# Patient Record
Sex: Female | Born: 1985 | Race: Black or African American | Hispanic: No | Marital: Married | Smoking: Former smoker
Health system: Southern US, Community
[De-identification: ages and names within clinical notes are randomized; demographics above are authoritative.]

## PROBLEM LIST (undated history)

## (undated) ENCOUNTER — Inpatient Hospital Stay (HOSPITAL_COMMUNITY): Payer: Self-pay

## (undated) DIAGNOSIS — D573 Sickle-cell trait: Secondary | ICD-10-CM

## (undated) DIAGNOSIS — B9689 Other specified bacterial agents as the cause of diseases classified elsewhere: Secondary | ICD-10-CM

## (undated) DIAGNOSIS — R87619 Unspecified abnormal cytological findings in specimens from cervix uteri: Secondary | ICD-10-CM

## (undated) DIAGNOSIS — B009 Herpesviral infection, unspecified: Secondary | ICD-10-CM

## (undated) DIAGNOSIS — IMO0002 Reserved for concepts with insufficient information to code with codable children: Secondary | ICD-10-CM

## (undated) DIAGNOSIS — A749 Chlamydial infection, unspecified: Secondary | ICD-10-CM

## (undated) DIAGNOSIS — N760 Acute vaginitis: Secondary | ICD-10-CM

## (undated) DIAGNOSIS — J45909 Unspecified asthma, uncomplicated: Secondary | ICD-10-CM

## (undated) HISTORY — DX: Unspecified asthma, uncomplicated: J45.909

## (undated) HISTORY — DX: Sickle-cell trait: D57.3

## (undated) HISTORY — PX: COLPOSCOPY W/ BIOPSY / CURETTAGE: SUR283

---

## 2008-05-18 ENCOUNTER — Emergency Department (HOSPITAL_COMMUNITY): Admission: EM | Admit: 2008-05-18 | Discharge: 2008-05-18 | Payer: Self-pay | Admitting: Family Medicine

## 2010-04-27 ENCOUNTER — Emergency Department (HOSPITAL_BASED_OUTPATIENT_CLINIC_OR_DEPARTMENT_OTHER): Admission: EM | Admit: 2010-04-27 | Discharge: 2010-04-27 | Payer: Self-pay | Admitting: Emergency Medicine

## 2010-08-17 ENCOUNTER — Emergency Department (HOSPITAL_COMMUNITY)
Admission: EM | Admit: 2010-08-17 | Discharge: 2010-08-17 | Payer: Self-pay | Source: Home / Self Care | Admitting: Emergency Medicine

## 2010-11-19 ENCOUNTER — Ambulatory Visit (HOSPITAL_COMMUNITY)
Admission: RE | Admit: 2010-11-19 | Discharge: 2010-11-19 | Payer: Self-pay | Source: Home / Self Care | Attending: Obstetrics and Gynecology | Admitting: Obstetrics and Gynecology

## 2010-11-22 ENCOUNTER — Inpatient Hospital Stay (HOSPITAL_COMMUNITY)
Admission: AD | Admit: 2010-11-22 | Discharge: 2010-11-22 | Payer: Self-pay | Source: Home / Self Care | Attending: Obstetrics and Gynecology | Admitting: Obstetrics and Gynecology

## 2010-11-26 ENCOUNTER — Ambulatory Visit (HOSPITAL_COMMUNITY)
Admission: RE | Admit: 2010-11-26 | Discharge: 2010-11-26 | Payer: Self-pay | Source: Home / Self Care | Attending: Obstetrics and Gynecology | Admitting: Obstetrics and Gynecology

## 2010-11-29 ENCOUNTER — Inpatient Hospital Stay (HOSPITAL_COMMUNITY)
Admission: AD | Admit: 2010-11-29 | Discharge: 2010-12-01 | Payer: Self-pay | Source: Home / Self Care | Attending: Obstetrics and Gynecology | Admitting: Obstetrics and Gynecology

## 2011-02-10 LAB — URINALYSIS, ROUTINE W REFLEX MICROSCOPIC
Bilirubin Urine: NEGATIVE
Glucose, UA: NEGATIVE mg/dL
Ketones, ur: NEGATIVE mg/dL
Leukocytes, UA: NEGATIVE
Specific Gravity, Urine: <1.005 — ABNORMAL LOW (ref 1.005–1.030)
pH: 6 (ref 5.0–8.0)

## 2011-02-10 LAB — CBC
Hemoglobin: 10.3 g/dL — ABNORMAL LOW (ref 12.0–15.0)
MCH: 21.1 pg — ABNORMAL LOW (ref 26.0–34.0)
MCV: 67 fL — ABNORMAL LOW (ref 78.0–100.0)
Platelets: 225 10*3/uL (ref 150–400)
Platelets: 241 10*3/uL (ref 150–400)
RBC: 4.64 MIL/uL (ref 3.87–5.11)
RBC: 4.89 MIL/uL (ref 3.87–5.11)
RDW: 21.8 % — ABNORMAL HIGH (ref 11.5–15.5)
WBC: 17.2 10*3/uL — ABNORMAL HIGH (ref 4.0–10.5)

## 2011-02-10 LAB — COMPREHENSIVE METABOLIC PANEL
AST: 24 U/L (ref 0–37)
Alkaline Phosphatase: 246 U/L — ABNORMAL HIGH (ref 39–117)
Alkaline Phosphatase: 273 U/L — ABNORMAL HIGH (ref 39–117)
BUN: 3 mg/dL — ABNORMAL LOW (ref 6–23)
Calcium: 8.7 mg/dL (ref 8.4–10.5)
Chloride: 108 mEq/L (ref 96–112)
Creatinine, Ser: 0.57 mg/dL (ref 0.4–1.2)
GFR calc Af Amer: >60 mL/min (ref >60)
GFR calc non Af Amer: >60 mL/min (ref >60)
Glucose, Bld: 122 mg/dL — ABNORMAL HIGH (ref 70–99)
Glucose, Bld: 87 mg/dL (ref 70–99)
Potassium: 3.5 mEq/L (ref 3.5–5.1)
Potassium: 3.5 mEq/L (ref 3.5–5.1)
Sodium: 137 mEq/L (ref 135–145)

## 2011-02-10 LAB — URINE MICROSCOPIC-ADD ON

## 2011-02-10 LAB — RPR: RPR Ser Ql: NONREACTIVE

## 2011-02-10 LAB — URIC ACID: Uric Acid, Serum: 5.3 mg/dL (ref 2.4–7.0)

## 2011-02-13 LAB — POCT URINALYSIS DIPSTICK
Bilirubin Urine: NEGATIVE
Glucose, UA: NEGATIVE mg/dL
Hgb urine dipstick: NEGATIVE
Ketones, ur: NEGATIVE mg/dL
Nitrite: NEGATIVE
Protein, ur: NEGATIVE mg/dL
Specific Gravity, Urine: 1.015 (ref 1.005–1.030)

## 2011-02-17 LAB — URINE CULTURE: Colony Count: 50000

## 2011-02-17 LAB — WET PREP, GENITAL

## 2011-02-17 LAB — URINALYSIS, ROUTINE W REFLEX MICROSCOPIC
Bilirubin Urine: NEGATIVE
Glucose, UA: NEGATIVE mg/dL
Ketones, ur: NEGATIVE mg/dL
Protein, ur: NEGATIVE mg/dL
Specific Gravity, Urine: 1.018 (ref 1.005–1.030)
Urobilinogen, UA: 0.2 mg/dL (ref 0.0–1.0)
pH: 5.5 (ref 5.0–8.0)

## 2011-02-17 LAB — RPR: RPR Ser Ql: NONREACTIVE

## 2011-02-17 LAB — GC/CHLAMYDIA PROBE AMP, GENITAL: Chlamydia, DNA Probe: NEGATIVE

## 2011-02-17 LAB — URINE MICROSCOPIC-ADD ON

## 2011-03-17 ENCOUNTER — Ambulatory Visit (INDEPENDENT_AMBULATORY_CARE_PROVIDER_SITE_OTHER): Payer: BLUE CROSS/BLUE SHIELD

## 2011-03-17 ENCOUNTER — Inpatient Hospital Stay (INDEPENDENT_AMBULATORY_CARE_PROVIDER_SITE_OTHER)
Admission: RE | Admit: 2011-03-17 | Discharge: 2011-03-17 | Disposition: A | Discharge status: Home / Self Care | Payer: BLUE CROSS/BLUE SHIELD | Source: Ambulatory Visit | Attending: Family Medicine | Admitting: Family Medicine

## 2011-03-17 DIAGNOSIS — R109 Unspecified abdominal pain: Secondary | ICD-10-CM

## 2011-03-17 LAB — WET PREP, GENITAL
Trich, Wet Prep: NONE SEEN
Yeast Wet Prep HPF POC: NONE SEEN

## 2011-03-17 LAB — POCT URINALYSIS DIP (DEVICE)
Hgb urine dipstick: NEGATIVE
Specific Gravity, Urine: 1.02 (ref 1.005–1.030)
Urobilinogen, UA: 0.2 mg/dL (ref 0.0–1.0)

## 2011-03-18 LAB — GC/CHLAMYDIA PROBE AMP, GENITAL: GC Probe Amp, Genital: NEGATIVE

## 2011-08-28 LAB — WET PREP, GENITAL: Trich, Wet Prep: NONE SEEN

## 2012-08-29 ENCOUNTER — Encounter (HOSPITAL_COMMUNITY): Payer: Self-pay | Admitting: Obstetrics and Gynecology

## 2012-08-29 ENCOUNTER — Inpatient Hospital Stay (HOSPITAL_COMMUNITY)
Admission: AD | Admit: 2012-08-29 | Discharge: 2012-08-29 | Disposition: A | Discharge status: Home / Self Care | Payer: Self-pay | Source: Ambulatory Visit | Attending: Obstetrics and Gynecology | Admitting: Obstetrics and Gynecology

## 2012-08-29 DIAGNOSIS — B9689 Other specified bacterial agents as the cause of diseases classified elsewhere: Secondary | ICD-10-CM | POA: Insufficient documentation

## 2012-08-29 DIAGNOSIS — O2390 Unspecified genitourinary tract infection in pregnancy, unspecified trimester: Secondary | ICD-10-CM | POA: Insufficient documentation

## 2012-08-29 DIAGNOSIS — N760 Acute vaginitis: Secondary | ICD-10-CM | POA: Insufficient documentation

## 2012-08-29 DIAGNOSIS — A499 Bacterial infection, unspecified: Secondary | ICD-10-CM | POA: Insufficient documentation

## 2012-08-29 DIAGNOSIS — N949 Unspecified condition associated with female genital organs and menstrual cycle: Secondary | ICD-10-CM | POA: Insufficient documentation

## 2012-08-29 DIAGNOSIS — Z3490 Encounter for supervision of normal pregnancy, unspecified, unspecified trimester: Secondary | ICD-10-CM

## 2012-08-29 HISTORY — DX: Unspecified abnormal cytological findings in specimens from cervix uteri: R87.619

## 2012-08-29 HISTORY — DX: Other specified bacterial agents as the cause of diseases classified elsewhere: B96.89

## 2012-08-29 HISTORY — DX: Reserved for concepts with insufficient information to code with codable children: IMO0002

## 2012-08-29 HISTORY — DX: Other specified bacterial agents as the cause of diseases classified elsewhere: N76.0

## 2012-08-29 LAB — WET PREP, GENITAL
Trich, Wet Prep: NONE SEEN
Yeast Wet Prep HPF POC: NONE SEEN

## 2012-08-29 LAB — POCT PREGNANCY, URINE: Preg Test, Ur: POSITIVE — AB

## 2012-08-29 MED ORDER — METRONIDAZOLE 500 MG PO TABS: 500.0000 mg | ORAL_TABLET | Freq: Two times a day (BID) | ORAL | Status: DC

## 2012-08-29 NOTE — MAU Note (Signed)
"  I have a thick yellowish d/c that has a foul odor for the past 3 weeks.  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  I've been having some abd cramping and back pain."

## 2012-08-29 NOTE — MAU Note (Signed)
Patient states she has had a vaginal discharge with odor for about 3 weeks. Has had a positive pregnancy test at [facility]. Has not seen an MD yet pending Medicaid.

## 2012-08-29 NOTE — MAU Provider Note (Signed)
History     CSN: [id number]  Arrival date & time 08/29/12  1444   None     Chief Complaint  Patient presents with  . Vaginal Discharge    (Consider location/radiation/quality/duration/timing/severity/associated sxs/prior treatment) HPI Angela Ashley is a 26 y.o. G2P1001 at [redacted]w[redacted]d. Pt presents with c/o BV. She gets this frequently, has yellow/green discharge, foul odor, no itching or irritation. Same partner x 5 yr. No cramping robleeding. Plans [facility] with Dr Ambrose Mantle, is waiting to get Medicaid started.   Past Medical History  Diagnosis Date  . Abnormal Pap smear   . Bacterial vaginosis     reoccuring    Past Surgical History  Procedure Date  . Colposcopy w/ biopsy / curettage     Dr. in [facility]    Family History  Problem Relation Age of Onset  . Diabetes Paternal Grandmother     History  Substance Use Topics  . Smoking status: Former Smoker    Types: Cigarettes  . Smokeless tobacco: Not on file  . Alcohol Use: No     Occassional use before pregnancy    OB History    Grav Para Term Preterm Abortions TAB SAB Ect Mult Living   2 1 1       1       Review of Systems  Constitutional: Negative for fever and chills.  Genitourinary: Positive for frequency and vaginal discharge. Negative for dysuria, urgency and vaginal bleeding.    Allergies  Review of patient's allergies indicates not on file.  Home Medications  No current outpatient prescriptions on file.  BP 106/76  Pulse 95  Temp 98.6 F (37 C) (Oral)  Resp 16  Ht 4' 10.5" (1.486 m)  Wt 94 lb (42.638 kg)  BMI 19.31 kg/m2  SpO2 100%  LMP 06/26/2012  Breastfeeding? Unknown  Physical Exam  Constitutional: She is oriented to person, place, and time. She appears well-developed and well-nourished. No distress.  Abdominal: Soft. There is no tenderness. There is no rebound and no guarding.       Informal U/S, + cardiac activity  Genitourinary:       Pelvic: Vulva- skin intact, nl anatomy Vagina-  mod amt creamy yellowish discharge Cx- nullip Uterus- 8-10 wk size Adn- no masses pal, non tender  Musculoskeletal: Normal range of motion.  Neurological: She is alert and oriented to person, place, and time.  Skin: Skin is warm and dry.  Psychiatric: She has a normal mood and affect. Her behavior is normal.    ED Course  Procedures (including critical care time)  Labs Reviewed  POCT PREGNANCY, URINE - Abnormal; Notable for the following:    Preg Test, Ur POSITIVE (*)     All other components within normal limits  WET PREP, GENITAL  GC/CHLAMYDIA PROBE AMP, GENITAL   No results found. , Results for orders placed during the hospital encounter of 08/29/12 (from the past 24 hour(s))  POCT PREGNANCY, URINE     Status: Abnormal   Collection Time   08/29/12  3:06 PM      Component Value Range   Preg Test, Ur POSITIVE (*) NEGATIVE  WET PREP, GENITAL     Status: Abnormal   Collection Time   08/29/12  3:30 PM      Component Value Range   Yeast Wet Prep HPF POC NONE SEEN  NONE SEEN   Trich, Wet Prep NONE SEEN  NONE SEEN   Clue Cells Wet Prep HPF POC MANY (*) NONE SEEN  WBC, Wet Prep HPF POC MODERATE (*) NONE SEEN     No diagnosis found.  ASSESSMENT: BV Pregnancy @ 9 wks GC/CT pending  PLAN:  Flagyl 500 mg BID x 7 Make appt for prenatal care when gets Medicaid  MDM

## 2012-08-31 LAB — GC/CHLAMYDIA PROBE AMP, GENITAL
Chlamydia, DNA Probe: POSITIVE — AB
GC Probe Amp, Genital: NEGATIVE

## 2012-09-06 ENCOUNTER — Other Ambulatory Visit: Payer: Self-pay | Admitting: Advanced Practice Midwife

## 2012-09-06 MED ORDER — METRONIDAZOLE 0.75 % VA GEL: 1.0000 | Freq: Two times a day (BID) | VAGINAL | Status: DC

## 2012-09-08 ENCOUNTER — Other Ambulatory Visit: Payer: Self-pay | Admitting: Advanced Practice Midwife

## 2012-09-08 NOTE — Progress Notes (Signed)
Pt called, unable to tolerate PO flagyl, requested rx for metrogel. Order sent to pharmacy.

## 2012-09-10 ENCOUNTER — Inpatient Hospital Stay (HOSPITAL_COMMUNITY)
Admission: AD | Admit: 2012-09-10 | Discharge: 2012-09-10 | Disposition: A | Discharge status: Home / Self Care | Payer: Medicaid Other | Source: Ambulatory Visit | Attending: Obstetrics & Gynecology | Admitting: Obstetrics & Gynecology

## 2012-09-10 ENCOUNTER — Encounter (HOSPITAL_COMMUNITY): Payer: Self-pay | Admitting: *Deleted

## 2012-09-10 ENCOUNTER — Inpatient Hospital Stay (HOSPITAL_COMMUNITY): Payer: Medicaid Other

## 2012-09-10 DIAGNOSIS — Z3490 Encounter for supervision of normal pregnancy, unspecified, unspecified trimester: Secondary | ICD-10-CM

## 2012-09-10 DIAGNOSIS — N739 Female pelvic inflammatory disease, unspecified: Secondary | ICD-10-CM | POA: Insufficient documentation

## 2012-09-10 DIAGNOSIS — O98319 Other infections with a predominantly sexual mode of transmission complicating pregnancy, unspecified trimester: Secondary | ICD-10-CM

## 2012-09-10 DIAGNOSIS — A5619 Other chlamydial genitourinary infection: Secondary | ICD-10-CM | POA: Insufficient documentation

## 2012-09-10 DIAGNOSIS — A568 Sexually transmitted chlamydial infection of other sites: Secondary | ICD-10-CM

## 2012-09-10 DIAGNOSIS — O98819 Other maternal infectious and parasitic diseases complicating pregnancy, unspecified trimester: Secondary | ICD-10-CM

## 2012-09-10 DIAGNOSIS — A749 Chlamydial infection, unspecified: Secondary | ICD-10-CM

## 2012-09-10 DIAGNOSIS — O209 Hemorrhage in early pregnancy, unspecified: Secondary | ICD-10-CM | POA: Insufficient documentation

## 2012-09-10 HISTORY — DX: Chlamydial infection, unspecified: A74.9

## 2012-09-10 MED ORDER — ONDANSETRON 8 MG PO TBDP
8.0000 mg | ORAL_TABLET | ORAL | Status: AC
  Administered 2012-09-10: 8 mg via ORAL
  Filled 2012-09-10: qty 1

## 2012-09-10 MED ORDER — AZITHROMYCIN 250 MG PO TABS
1000.0000 mg | ORAL_TABLET | ORAL | Status: AC
  Administered 2012-09-10: 1000 mg via ORAL
  Filled 2012-09-10: qty 4

## 2012-09-10 NOTE — MAU Provider Note (Signed)
Chart reviewed and agree with management and plan.  

## 2012-09-10 NOTE — MAU Note (Signed)
Bleeding started last night. Noticed when she wiped. A small blood clot came out. Bleeding has now stopped. Was dx with BV, given rx for flagyl. States has never been able to tolerate the tablets. Has since gotten Rx changed to gel and inserted first does last night prior to bleeding. Informed patient she was positive for chlamydia on last visit. Patient states she did not receive a call and has not been treated.  Denies pain. Last intercourse 3 weeks ago.

## 2012-09-10 NOTE — MAU Provider Note (Signed)
Chief Complaint: Vaginal Bleeding   None    SUBJECTIVE HPI: Angela Ashley is a 26 y.o. G2P1001 at [redacted]w[redacted]d by LMP who presents to maternity admissions reporting vaginal bleeding last night described as red when she wiped x1 episode with one small clot, but not requiring a pad afterwards.  She has had some brown spotting today but no more bright red bleeding.  She was seen in MAU 08/29/12 and prescribed Flagyl for BV which she was unable to tolerate because of vomiting.  She called MAU and her prescription was recently changed to Metrogel, which she started last night ~3 hours before episode of bleeding.  She denies LOF, vaginal itching/burning, urinary symptoms, h/a, dizziness, n/v, or fever/chills.  The pt was also informed today of her positive chlamydia test on 08/29/12.  She was not aware of this and reports never receiving a phone call.  The pt is married and was upset and shocked at this diagnosis.    She has not yet started prenatal care and because of her work schedule, she missed her first appointment to apply for Medicaid and has not yet completed her application.  She planned to see Dr Ambrose Mantle for prenatal care but does not want to wait another 45 days before beginning care.       Past Medical History  Diagnosis Date  . Abnormal Pap smear   . Bacterial vaginosis     reoccuring  . Chlamydia    Past Surgical History  Procedure Date  . Colposcopy w/ biopsy / curettage     Dr. in [place]   History   Social History  . Marital Status: Married    Spouse Name: N/A    Number of Children: N/A  . Years of Education: N/A   Occupational History  . Not on file.   Social History Main Topics  . Smoking status: Former Smoker    Types: Cigarettes  . Smokeless tobacco: Never Used  . Alcohol Use: No     Occassional use before pregnancy  . Drug Use: No  . Sexually Active: Yes    Birth Control/ Protection: None   Other Topics Concern  . Not on file   Social History Narrative    . No narrative on file   No current facility-administered medications on file prior to encounter.   No current outpatient prescriptions on file prior to encounter.   No Known Allergies  ROS: Pertinent items in HPI  OBJECTIVE Blood pressure 115/67, pulse 99, temperature 98.5 F (36.9 C), temperature source Oral, resp. rate 18, height 5' (1.524 m), weight 44.453 kg (98 lb), last menstrual period 06/26/2012, SpO2 100.00%. GENERAL: Well-developed, well-nourished female in no acute distress.  HEENT: Normocephalic HEART: normal rate RESP: normal effort ABDOMEN: Soft, non-tender EXTREMITIES: Nontender, no edema NEURO: Alert and oriented Pelvic exam: Cervix pink with some erythema at os and posterior side of cervix, visually closed, large amount of brown, thick discharge, no bright red blood noted, vaginal walls and external genitalia normal Bimanual exam: Cervix 0/long/high, firm, posterior, neg CMT, uterus nontender, ~10 week size, adnexa without tenderness, enlargement, or mass   IMAGING    ASSESSMENT 1. Chlamydia infection complicating pregnancy   2. Normal IUP (intrauterine pregnancy) on prenatal ultrasound     PLAN Azithromycin 1g PO x1 dose now in MAU Reviewed normal U/S findings with pt Discharge home Encouraged partner treatment at health department Message sent to [facility] clinic for prenatal care and test of cure Return to MAU as needed.  Bleeding precautions given.    Medication List     As of 09/10/2012 10:25 AM    TAKE these medications         metroNIDAZOLE 0.75 % vaginal gel   Commonly known as: METROGEL   Place 1 Applicatorful vaginally 2 (two) times daily.      prenatal multivitamin Tabs   Take 1 tablet by mouth at bedtime.           Sharen Counter Certified Nurse-Midwife 09/10/2012  10:09 AM

## 2012-09-20 ENCOUNTER — Encounter: Payer: Self-pay | Admitting: Advanced Practice Midwife

## 2012-09-20 ENCOUNTER — Telehealth: Payer: Self-pay | Admitting: Advanced Practice Midwife

## 2012-09-20 NOTE — Telephone Encounter (Signed)
Called patient to inform her she had missed her appointment. Patient stated she was working and needed an afternoon appointment. When I informed her she could only come in the mornings, she stated she would call back later and reschedule.

## 2012-10-27 ENCOUNTER — Other Ambulatory Visit: Payer: Self-pay

## 2012-11-09 ENCOUNTER — Other Ambulatory Visit (HOSPITAL_COMMUNITY): Payer: Self-pay | Admitting: Obstetrics and Gynecology

## 2012-11-09 DIAGNOSIS — IMO0002 Reserved for concepts with insufficient information to code with codable children: Secondary | ICD-10-CM

## 2012-11-10 ENCOUNTER — Ambulatory Visit (HOSPITAL_COMMUNITY)
Admission: RE | Admit: 2012-11-10 | Discharge: 2012-11-10 | Disposition: A | Discharge status: Home / Self Care | Payer: Medicaid Other | Source: Ambulatory Visit | Attending: Obstetrics and Gynecology | Admitting: Obstetrics and Gynecology

## 2012-11-10 DIAGNOSIS — IMO0002 Reserved for concepts with insufficient information to code with codable children: Secondary | ICD-10-CM

## 2012-11-10 DIAGNOSIS — O358XX0 Maternal care for other (suspected) fetal abnormality and damage, not applicable or unspecified: Secondary | ICD-10-CM | POA: Insufficient documentation

## 2012-11-12 ENCOUNTER — Encounter: Payer: Self-pay | Admitting: Obstetrics and Gynecology

## 2013-03-26 ENCOUNTER — Encounter (HOSPITAL_COMMUNITY)
Admission: AD | Disposition: A | Discharge status: Home / Self Care | Payer: Self-pay | Source: Ambulatory Visit | Attending: Obstetrics and Gynecology

## 2013-03-26 ENCOUNTER — Inpatient Hospital Stay (HOSPITAL_COMMUNITY)
Admission: AD | Admit: 2013-03-26 | Discharge: 2013-03-29 | DRG: 766 | Disposition: A | Discharge status: Home / Self Care | Payer: Medicaid Other | Source: Ambulatory Visit | Attending: Obstetrics and Gynecology | Admitting: Obstetrics and Gynecology

## 2013-03-26 ENCOUNTER — Encounter (HOSPITAL_COMMUNITY): Payer: Self-pay | Admitting: Anesthesiology

## 2013-03-26 ENCOUNTER — Encounter (HOSPITAL_COMMUNITY): Payer: Self-pay | Admitting: *Deleted

## 2013-03-26 ENCOUNTER — Inpatient Hospital Stay (HOSPITAL_COMMUNITY): Payer: Medicaid Other | Admitting: Anesthesiology

## 2013-03-26 DIAGNOSIS — B009 Herpesviral infection, unspecified: Secondary | ICD-10-CM

## 2013-03-26 DIAGNOSIS — O4290 Premature rupture of membranes, unspecified as to length of time between rupture and onset of labor, unspecified weeks of gestation: Secondary | ICD-10-CM | POA: Diagnosis present

## 2013-03-26 DIAGNOSIS — O98519 Other viral diseases complicating pregnancy, unspecified trimester: Principal | ICD-10-CM | POA: Diagnosis present

## 2013-03-26 DIAGNOSIS — O368190 Decreased fetal movements, unspecified trimester, not applicable or unspecified: Secondary | ICD-10-CM | POA: Diagnosis present

## 2013-03-26 DIAGNOSIS — A6000 Herpesviral infection of urogenital system, unspecified: Secondary | ICD-10-CM | POA: Diagnosis present

## 2013-03-26 HISTORY — DX: Herpesviral infection, unspecified: B00.9

## 2013-03-26 LAB — CBC WITH DIFFERENTIAL/PLATELET
Eosinophils Relative: 1 % (ref 0–5)
HCT: 33 % — ABNORMAL LOW (ref 36.0–46.0)
Hemoglobin: 10.7 g/dL — ABNORMAL LOW (ref 12.0–15.0)
Lymphocytes Relative: 20 % (ref 12–46)
Lymphs Abs: 2.4 10*3/uL (ref 0.7–4.0)
MCV: 74.3 fL — ABNORMAL LOW (ref 78.0–100.0)
Platelets: 188 10*3/uL (ref 150–400)
RBC: 4.44 MIL/uL (ref 3.87–5.11)
WBC: 12.2 10*3/uL — ABNORMAL HIGH (ref 4.0–10.5)

## 2013-03-26 LAB — RPR: RPR Ser Ql: NONREACTIVE

## 2013-03-26 SURGERY — Surgical Case
Anesthesia: Spinal | Site: Abdomen | Wound class: Clean Contaminated

## 2013-03-26 MED ORDER — METOCLOPRAMIDE HCL 5 MG/ML IJ SOLN: 10.0000 mg | Freq: Three times a day (TID) | INTRAMUSCULAR | Status: DC | PRN

## 2013-03-26 MED ORDER — ONDANSETRON HCL 4 MG/2ML IJ SOLN: 4.0000 mg | INTRAMUSCULAR | Status: DC | PRN

## 2013-03-26 MED ORDER — ONDANSETRON HCL 4 MG PO TABS: 4.0000 mg | ORAL_TABLET | ORAL | Status: DC | PRN

## 2013-03-26 MED ORDER — MORPHINE SULFATE (PF) 0.5 MG/ML IJ SOLN
INTRAMUSCULAR | Status: DC | PRN
  Administered 2013-03-26: .15 mg via INTRATHECAL

## 2013-03-26 MED ORDER — LANOLIN HYDROUS EX OINT: 1.0000 "application " | TOPICAL_OINTMENT | CUTANEOUS | Status: DC | PRN

## 2013-03-26 MED ORDER — FENTANYL CITRATE 0.05 MG/ML IJ SOLN: 25.0000 ug | INTRAMUSCULAR | Status: DC | PRN

## 2013-03-26 MED ORDER — OXYTOCIN 40 UNITS IN LACTATED RINGERS INFUSION - SIMPLE MED: 62.5000 mL/h | INTRAVENOUS | Status: AC

## 2013-03-26 MED ORDER — KETOROLAC TROMETHAMINE 30 MG/ML IJ SOLN: 30.0000 mg | Freq: Four times a day (QID) | INTRAMUSCULAR | Status: AC | PRN

## 2013-03-26 MED ORDER — NALOXONE HCL 1 MG/ML IJ SOLN
1.0000 ug/kg/h | INTRAVENOUS | Status: DC | PRN
  Filled 2013-03-26: qty 2

## 2013-03-26 MED ORDER — DIPHENHYDRAMINE HCL 50 MG/ML IJ SOLN: 25.0000 mg | INTRAMUSCULAR | Status: DC | PRN

## 2013-03-26 MED ORDER — DIPHENHYDRAMINE HCL 50 MG/ML IJ SOLN
12.5000 mg | INTRAMUSCULAR | Status: DC | PRN
  Administered 2013-03-27: 12.5 mg via INTRAVENOUS
  Filled 2013-03-26: qty 1

## 2013-03-26 MED ORDER — CEFAZOLIN SODIUM-DEXTROSE 2-3 GM-% IV SOLR
INTRAVENOUS | Status: AC
  Administered 2013-03-26: 20:00:00
  Filled 2013-03-26: qty 50

## 2013-03-26 MED ORDER — SIMETHICONE 80 MG PO CHEW
80.0000 mg | CHEWABLE_TABLET | Freq: Three times a day (TID) | ORAL | Status: DC
  Administered 2013-03-27 – 2013-03-29 (×9): 80 mg via ORAL

## 2013-03-26 MED ORDER — WITCH HAZEL-GLYCERIN EX PADS: 1.0000 "application " | MEDICATED_PAD | CUTANEOUS | Status: DC | PRN

## 2013-03-26 MED ORDER — ONDANSETRON HCL 4 MG/2ML IJ SOLN
INTRAMUSCULAR | Status: DC | PRN
  Administered 2013-03-26: 4 mg via INTRAVENOUS

## 2013-03-26 MED ORDER — NALBUPHINE HCL 10 MG/ML IJ SOLN
5.0000 mg | INTRAMUSCULAR | Status: DC | PRN
  Administered 2013-03-26 – 2013-03-27 (×3): 10 mg via SUBCUTANEOUS
  Filled 2013-03-26 (×2): qty 1

## 2013-03-26 MED ORDER — FAMOTIDINE IN NACL 20-0.9 MG/50ML-% IV SOLN
INTRAVENOUS | Status: AC
  Filled 2013-03-26: qty 50

## 2013-03-26 MED ORDER — LACTATED RINGERS IV SOLN
INTRAVENOUS | Status: DC
  Administered 2013-03-27: 10:00:00 via INTRAVENOUS

## 2013-03-26 MED ORDER — ONDANSETRON HCL 4 MG/2ML IJ SOLN: 4.0000 mg | Freq: Three times a day (TID) | INTRAMUSCULAR | Status: DC | PRN

## 2013-03-26 MED ORDER — NALBUPHINE HCL 10 MG/ML IJ SOLN
5.0000 mg | INTRAMUSCULAR | Status: DC | PRN
  Administered 2013-03-27: 5 mg via INTRAVENOUS
  Filled 2013-03-26 (×3): qty 1

## 2013-03-26 MED ORDER — MENTHOL 3 MG MT LOZG
1.0000 | LOZENGE | OROMUCOSAL | Status: DC | PRN
  Filled 2013-03-26: qty 9

## 2013-03-26 MED ORDER — PRENATAL MULTIVITAMIN CH
1.0000 | ORAL_TABLET | Freq: Every day | ORAL | Status: DC
  Administered 2013-03-28: 1 via ORAL
  Filled 2013-03-26 (×3): qty 1

## 2013-03-26 MED ORDER — MEASLES, MUMPS & RUBELLA VAC ~~LOC~~ INJ
0.5000 mL | INJECTION | Freq: Once | SUBCUTANEOUS | Status: DC
  Filled 2013-03-26: qty 0.5

## 2013-03-26 MED ORDER — DIPHENHYDRAMINE HCL 25 MG PO CAPS
25.0000 mg | ORAL_CAPSULE | Freq: Four times a day (QID) | ORAL | Status: DC | PRN
  Administered 2013-03-28: 25 mg via ORAL
  Filled 2013-03-26: qty 1

## 2013-03-26 MED ORDER — BUPIVACAINE IN DEXTROSE 0.75-8.25 % IT SOLN
INTRATHECAL | Status: DC | PRN
  Administered 2013-03-26: 12 mg via INTRATHECAL

## 2013-03-26 MED ORDER — SIMETHICONE 80 MG PO CHEW
80.0000 mg | CHEWABLE_TABLET | ORAL | Status: DC | PRN
  Administered 2013-03-27: 80 mg via ORAL

## 2013-03-26 MED ORDER — CEFAZOLIN SODIUM 1-5 GM-% IV SOLN
1.0000 g | Freq: Three times a day (TID) | INTRAVENOUS | Status: AC
  Administered 2013-03-27 (×2): 1 g via INTRAVENOUS
  Filled 2013-03-26 (×2): qty 50

## 2013-03-26 MED ORDER — FAMOTIDINE IN NACL 20-0.9 MG/50ML-% IV SOLN
20.0000 mg | Freq: Once | INTRAVENOUS | Status: AC
  Administered 2013-03-26: 20 mg via INTRAVENOUS

## 2013-03-26 MED ORDER — 0.9 % SODIUM CHLORIDE (POUR BTL) OPTIME
TOPICAL | Status: DC | PRN
  Administered 2013-03-26 (×2): 200 mL

## 2013-03-26 MED ORDER — SODIUM CHLORIDE 0.9 % IJ SOLN
3.0000 mL | INTRAMUSCULAR | Status: DC | PRN
  Administered 2013-03-27: 3 mL via INTRAVENOUS

## 2013-03-26 MED ORDER — SCOPOLAMINE 1 MG/3DAYS TD PT72
1.0000 | MEDICATED_PATCH | Freq: Once | TRANSDERMAL | Status: AC
  Administered 2013-03-26 – 2013-03-27 (×2): 1.5 mg via TRANSDERMAL
  Filled 2013-03-26: qty 1

## 2013-03-26 MED ORDER — OXYCODONE-ACETAMINOPHEN 5-325 MG PO TABS
1.0000 | ORAL_TABLET | ORAL | Status: DC | PRN
  Administered 2013-03-27 – 2013-03-29 (×6): 1 via ORAL
  Filled 2013-03-26 (×6): qty 1

## 2013-03-26 MED ORDER — DIPHENHYDRAMINE HCL 25 MG PO CAPS
25.0000 mg | ORAL_CAPSULE | ORAL | Status: DC | PRN
  Filled 2013-03-26 (×2): qty 1

## 2013-03-26 MED ORDER — FENTANYL CITRATE 0.05 MG/ML IJ SOLN
INTRAMUSCULAR | Status: DC | PRN
  Administered 2013-03-26: 25 ug via INTRAVENOUS

## 2013-03-26 MED ORDER — CITRIC ACID-SODIUM CITRATE 334-500 MG/5ML PO SOLN
30.0000 mL | Freq: Once | ORAL | Status: AC
  Administered 2013-03-26: 30 mL via ORAL
  Filled 2013-03-26: qty 15

## 2013-03-26 MED ORDER — DIBUCAINE 1 % RE OINT
1.0000 "application " | TOPICAL_OINTMENT | RECTAL | Status: DC | PRN
  Filled 2013-03-26: qty 28

## 2013-03-26 MED ORDER — MEPERIDINE HCL 25 MG/ML IJ SOLN: 6.2500 mg | INTRAMUSCULAR | Status: DC | PRN

## 2013-03-26 MED ORDER — NALOXONE HCL 0.4 MG/ML IJ SOLN: 0.4000 mg | INTRAMUSCULAR | Status: DC | PRN

## 2013-03-26 MED ORDER — PHENYLEPHRINE HCL 10 MG/ML IJ SOLN
INTRAMUSCULAR | Status: DC | PRN
  Administered 2013-03-26 (×2): 40 ug via INTRAVENOUS

## 2013-03-26 MED ORDER — KETOROLAC TROMETHAMINE 30 MG/ML IJ SOLN
30.0000 mg | Freq: Four times a day (QID) | INTRAMUSCULAR | Status: AC | PRN
  Administered 2013-03-26: 30 mg via INTRAMUSCULAR

## 2013-03-26 MED ORDER — OXYTOCIN 10 UNIT/ML IJ SOLN
40.0000 [IU] | INTRAVENOUS | Status: DC | PRN
  Administered 2013-03-26: 40 [IU] via INTRAVENOUS

## 2013-03-26 MED ORDER — IBUPROFEN 600 MG PO TABS
600.0000 mg | ORAL_TABLET | Freq: Four times a day (QID) | ORAL | Status: DC
  Administered 2013-03-27 – 2013-03-29 (×10): 600 mg via ORAL
  Filled 2013-03-26 (×10): qty 1

## 2013-03-26 MED ORDER — TETANUS-DIPHTH-ACELL PERTUSSIS 5-2.5-18.5 LF-MCG/0.5 IM SUSP
0.5000 mL | Freq: Once | INTRAMUSCULAR | Status: DC
  Filled 2013-03-26: qty 0.5

## 2013-03-26 MED ORDER — SENNOSIDES-DOCUSATE SODIUM 8.6-50 MG PO TABS
2.0000 | ORAL_TABLET | Freq: Every day | ORAL | Status: DC
  Administered 2013-03-27 – 2013-03-28 (×2): 2 via ORAL

## 2013-03-26 MED ORDER — ZOLPIDEM TARTRATE 5 MG PO TABS: 5.0000 mg | ORAL_TABLET | Freq: Every evening | ORAL | Status: DC | PRN

## 2013-03-26 MED ORDER — LACTATED RINGERS IV SOLN
INTRAVENOUS | Status: DC
  Administered 2013-03-26 (×3): via INTRAVENOUS

## 2013-03-26 SURGICAL SUPPLY — 31 items
CLOTH BEACON ORANGE TIMEOUT ST (SAFETY) ×2 IMPLANT
CONTAINER PREFILL 10% NBF 15ML (MISCELLANEOUS) IMPLANT
DRAPE LG THREE QUARTER DISP (DRAPES) ×2 IMPLANT
DRSG OPSITE POSTOP 4X10 (GAUZE/BANDAGES/DRESSINGS) ×2 IMPLANT
DRSG VASELINE 3X18 (GAUZE/BANDAGES/DRESSINGS) ×2 IMPLANT
DURAPREP 26ML APPLICATOR (WOUND CARE) ×2 IMPLANT
ELECT REM PT RETURN 9FT ADLT (ELECTROSURGICAL) ×2
ELECTRODE REM PT RTRN 9FT ADLT (ELECTROSURGICAL) ×1 IMPLANT
EXTRACTOR VACUUM KIWI (MISCELLANEOUS) IMPLANT
EXTRACTOR VACUUM M CUP 4 TUBE (SUCTIONS) IMPLANT
GLOVE BIO SURGEON STRL SZ7.5 (GLOVE) ×2 IMPLANT
GOWN PREVENTION PLUS XLARGE (GOWN DISPOSABLE) ×2 IMPLANT
GOWN STRL REIN XL XLG (GOWN DISPOSABLE) ×4 IMPLANT
KIT ABG SYR 3ML LUER SLIP (SYRINGE) IMPLANT
NEEDLE HYPO 25X5/8 SAFETYGLIDE (NEEDLE) IMPLANT
NS IRRIG 1000ML POUR BTL (IV SOLUTION) ×2 IMPLANT
PACK C SECTION WH (CUSTOM PROCEDURE TRAY) ×2 IMPLANT
PAD OB MATERNITY 4.3X12.25 (PERSONAL CARE ITEMS) ×2 IMPLANT
RTRCTR C-SECT PINK 25CM LRG (MISCELLANEOUS) ×2 IMPLANT
SLEEVE SCD COMPRESS KNEE MED (MISCELLANEOUS) ×2 IMPLANT
STAPLER VISISTAT 35W (STAPLE) ×2 IMPLANT
SUT PLAIN 0 NONE (SUTURE) IMPLANT
SUT VIC AB 0 CT1 36 (SUTURE) ×12 IMPLANT
SUT VIC AB 3-0 CTX 36 (SUTURE) ×2 IMPLANT
SUT VIC AB 3-0 SH 27 (SUTURE)
SUT VIC AB 3-0 SH 27X BRD (SUTURE) IMPLANT
SUT VIC AB 4-0 KS 27 (SUTURE) IMPLANT
SUT VICRYL 0 TIES 12 18 (SUTURE) IMPLANT
TOWEL OR 17X24 6PK STRL BLUE (TOWEL DISPOSABLE) ×6 IMPLANT
TRAY FOLEY CATH 14FR (SET/KITS/TRAYS/PACK) ×2 IMPLANT
WATER STERILE IRR 1000ML POUR (IV SOLUTION) ×2 IMPLANT

## 2013-03-26 NOTE — MAU Note (Signed)
Pt reprots her water broke at [employer] 20 min ago. Clear fluid haiving some contractions. Stated has not felt baby move much since water broke. She is scheduled for a c-section because of herpes breakout.

## 2013-03-26 NOTE — Op Note (Signed)
NAMEMarland Kitchen  Angela Ashley, Angela Ashley             ACCOUNT NO.:  [id number]  MEDICAL RECORD NO.:  [id number]  LOCATION:  [facility]                          [facility]  PHYSICIAN:  Malachi Pro. Ambrose Mantle, M.D. DATE OF BIRTH:  [birth year]  DATE OF PROCEDURE:  03/26/2013 DATE OF DISCHARGE:                              OPERATIVE REPORT   POSTOPERATIVE DIAGNOSES: 1. Intrauterine pregnancy at 39 weeks. 2. 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  POSTOPERATIVE DIAGNOSES: 1. Intrauterine pregnancy at 39 weeks. 2. 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  OPERATION:  Low-transverse cervical cesarean section.  OPERATOR:  Malachi Pro. Ambrose Mantle, M.D.  ANESTHESIA:  Spinal anesthesia by Angelica Pou, MD  DESCRIPTION OF PROCEDURE:  The patient was brought to the operating room and given a spinal anesthetic by Dr. Malen Gauze.  She was placed in left lateral tilt position.  The urethra was prepped with Betadine solution and a Foley catheter was inserted to straight drain by the RN.  A DuraPrep was then used to prep the abdominal wall and was allowed to dry for 3 minutes, during which time a time-out was done.  The abdomen was then draped as a sterile field.  After anesthesia was confirmed, a transverse incision was made suprapubically through the skin and subcutaneous tissue.  The fascia was incised transversely, separated from the rectus muscles superiorly and inferiorly.  Rectus muscle split in the midline.  Peritoneum opened vertically.  An Alexis retractor was placed into the abdominal cavity and gave good visualization of the lower uterine segment.  A short transverse incision was made through the superficial layers of the myometrium and then I used my finger to enter the amniotic sac.  I pulled superiorly and inferiorly.  The cord was present along with the vertex.  I easily delivered the vertex through the incisional opening, suctioned the baby's nose and mouth.  Delivered the rest of the baby.   Clamped the cord, cut it, and gave the baby to Dr. Everardo All who is in attendance.  He assigned the Apgars.  The baby was very vigorous.  I removed the placenta intact.  The inside of the uterus was palpated, found to be free of any products of conception.  I used the ring forceps to ensure that the cervix was open.  I then closed the uterine incision in 2 layers using a running lock suture of 0 Vicryl on the first layer, nonlocking suture of the same material on the second layer.  Hemostasis was adequate.  I visualized both tubes to their fimbriated ends.  Both ovaries appeared normal.  The uterus was normal. Liberal irrigation confirmed hemostasis.  The gutters were blotted free of blood.  The atelectasis retractor was removed and the abdominal wall was closed in layers using interrupted sutures of 0 Vicryl to close the rectus muscle and peritoneum in 1 layer, 2 running sutures of 0 Vicryl on the fascia, running 3-0 Vicryl on the subcu tissue, and staples on the skin.  The patient seemed to tolerate the procedure well.  Blood loss was about a 1000 mL.  Sponge and needle counts were correct and she was returned to the recovery room in satisfactory condition.  Malachi Pro. Ambrose Mantle, M.D.     TFH/MEDQ  D:  03/26/2013  T:  03/26/2013  Job:  [id number]

## 2013-03-26 NOTE — Anesthesia Procedure Notes (Signed)
Spinal  Patient location during procedure: OR Start time: 03/26/2013 8:32 PM Staffing Anesthesiologist: Huntleigh Doolen A. Performed by: anesthesiologist  Preanesthetic Checklist Completed: patient identified, site marked, surgical consent, pre-op evaluation, timeout performed, IV checked, risks and benefits discussed and monitors and equipment checked Spinal Block Patient position: sitting Prep: site prepped and draped and DuraPrep Patient monitoring: heart rate, cardiac monitor, continuous pulse ox and blood pressure Approach: midline Location: L3-4 Injection technique: single-shot Needle Needle type: Sprotte  Needle gauge: 24 G Needle length: 9 cm Assessment Sensory level: T4 Additional Notes Patient tolerated procedure well. Adequate sensory level.

## 2013-03-26 NOTE — MAU Note (Signed)
FHT 145-150 x 3 min after spinal placement-doppler d/c'd for abd prep

## 2013-03-26 NOTE — Anesthesia Preprocedure Evaluation (Signed)
Anesthesia Evaluation  Patient identified by MRN, date of birth, ID band Patient awake    Reviewed: Allergy & Precautions, H&P , Patient's Chart, lab work & pertinent test results  Airway Mallampati: III TM Distance: >3 FB Neck ROM: full    Dental no notable dental hx. (+) Teeth Intact   Pulmonary former smoker,  breath sounds clear to auscultation  Pulmonary exam normal       Cardiovascular negative cardio ROS  Rhythm:regular Rate:Normal     Neuro/Psych negative neurological ROS  negative psych ROS   GI/Hepatic negative GI ROS, Neg liver ROS,   Endo/Other  negative endocrine ROS  Renal/GU negative Renal ROS  negative genitourinary   Musculoskeletal   Abdominal Normal abdominal exam  (+)   Peds  Hematology negative hematology ROS (+)   Anesthesia Other Findings   Reproductive/Obstetrics (+) Pregnancy SROM 39 weeks  Active labor Active Herpes                           Anesthesia Physical Anesthesia Plan  ASA: II and emergent  Anesthesia Plan: Spinal   Post-op Pain Management:    Induction:   Airway Management Planned:   Additional Equipment:   Intra-op Plan:   Post-operative Plan:   Informed Consent: I have reviewed the patients History and Physical, chart, labs and discussed the procedure including the risks, benefits and alternatives for the proposed anesthesia with the patient or authorized representative who has indicated his/her understanding and acceptance.     Plan Discussed with: Anesthesiologist, CRNA and Surgeon  Anesthesia Plan Comments:         Anesthesia Quick Evaluation

## 2013-03-26 NOTE — H&P (Signed)
NAMEMarland Ashley  SIERA, Ashley             ACCOUNT NO.:  [id number]  MEDICAL RECORD NO.:  [id number]  LOCATION:  [facility]                          [facility]  [facility]  PHYSICIAN:  Malachi Pro. Ambrose Mantle, M.D. DATE OF BIRTH:  1986/01/11  DATE OF ADMISSION:  03/26/2013 DATE OF DISCHARGE:                             HISTORY & PHYSICAL   HISTORY OF PRESENT ILLNESS:  This is a 27 year old black female para 1-0- 0-1, gravida 2 with EDC Apr 02, 2013, admitted with premature rupture of the membranes and an active herpes lesion.  Blood group and type O positive, negative antibody, rubella immune, RPR nonreactive.  Urine culture, 10,000 to 25,000 colonies.  Hepatitis B surface antigen negative, HIV negative, GC and Chlamydia negative.  Hemoglobin electrophoresis AS.  First trimester screen, cystic fibrosis screen, and AFP declined.  One-hour Glucola 106.  Group B strep not recorded.  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  She was placed on Valtrex in our own admission, she is not sure how well she took it because she stated that a lot of times it came up.  The herpes lesion has been present for more than 2 weeks and still present.  I examined her on March 25, 2013, and I have examined her again today and the herpes lesion is still present.  She is admitted for C-section.  PAST MEDICAL HISTORY:  Reveals some abnormal Pap smear in 2010.  She has had intermittent asthma.  She did have gonorrhea in 2011.  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  She has had no surgeries.  She has a history of trichomoniasis.  MEDICATIONS: 1. Valtrex 1 g a day. 2. Prenatal vitamins.  ALLERGIES:  She has no known drug allergies.  No latex allergy.  OBSTETRIC HISTORY:  In December 2011, she delivered a 5-pound 13-ounce female vaginally with epidural  anesthesia.  She was induced for pregnancy-induced high blood pressure.  She is a former drinker of alcohol.  She has no current history of tobacco, alcohol, or drug abuse.  PHYSICAL EXAMINATION:  GENERAL:  On admission, well-developed, well- nourished black female, having contractions. VITAL SIGNS:  Temperature 97.9, pulse 101, respirations 18, blood pressure 134/95. HEART:  Normal size and sounds.  No murmurs. LUNGS:  Clear to auscultation. ABDOMEN:  Soft.  Fundal height term size.  Rupture of membranes was confirmed by the MAU nurse.  The rupture of membranes occurred at 6:15 p.m. on March 26, 2013.  She does have a non-healed herpetic ulcer in the left lower labia majora.  ADMITTING IMPRESSION:  Intrauterine pregnancy at 39 weeks, active herpes genitalis.  The patient is prepared for cesarean section.  She understands and agrees to proceed.  I have discussed this with her on several occasions that if the lesion healed prior to labor or rupture of membranes, we could allow vaginal delivery and even though it has been time to heal, I think her taking of the medication has been improper.     Malachi Pro. Ambrose Mantle, M.D.     TFH/MEDQ  D:  03/26/2013  T:  03/26/2013  Job:  [id number]

## 2013-03-26 NOTE — Anesthesia Postprocedure Evaluation (Signed)
  Anesthesia Post-op Note  Patient: Angela Ashley  Procedure(s) Performed: Procedure(s): Primary CESAREAN SECTION of baby girl  at  2051  APGAR 9/9 (N/A)  Patient Location: PACU  Anesthesia Type:Spinal  Level of Consciousness: awake, alert  and oriented  Airway and Oxygen Therapy: Patient Spontanous Breathing  Post-op Pain: none  Post-op Assessment: Post-op Vital signs reviewed, Patient's Cardiovascular Status Stable, Respiratory Function Stable, Patent Airway, No signs of Nausea or vomiting, Pain level controlled, No headache, No backache, No residual numbness and No residual motor weakness  Post-op Vital Signs: Reviewed and stable  Complications: No apparent anesthesia complications

## 2013-03-26 NOTE — Transfer of Care (Signed)
Immediate Anesthesia Transfer of Care Note  Patient: Angela Ashley  Procedure(s) Performed: Procedure(s): Primary CESAREAN SECTION of baby girl  at  2051  APGAR 9/9 (N/A)  Patient Location: PACU  Anesthesia Type:Spinal  Level of Consciousness: awake and alert   Airway & Oxygen Therapy: Patient Spontanous Breathing  Post-op Assessment: Report given to PACU RN  Post vital signs: Reviewed  Complications: No apparent anesthesia complications

## 2013-03-26 NOTE — Preoperative (Signed)
Beta Blockers   Reason not to administer Beta Blockers:Not Applicable 

## 2013-03-26 NOTE — OR Nursing (Signed)
50 ml blood loss during fundal massage by DLWegner RN, cord blood x 2 to OR front desk 

## 2013-03-27 LAB — CBC
Hemoglobin: 9.6 g/dL — ABNORMAL LOW (ref 12.0–15.0)
MCH: 24.2 pg — ABNORMAL LOW (ref 26.0–34.0)
MCHC: 32.5 g/dL (ref 30.0–36.0)
Platelets: 171 10*3/uL (ref 150–400)
RDW: 17.8 % — ABNORMAL HIGH (ref 11.5–15.5)

## 2013-03-27 LAB — ABO/RH: ABO/RH(D): O POS

## 2013-03-27 NOTE — Progress Notes (Signed)
Patient ID: Angela Ashley, female   DOB: Nov 19, 1986, 27 y.o.   MRN: [id number] #1 afebrile BP normal HGB 10.7 to 9.6 No complaints Urine clear

## 2013-03-27 NOTE — Anesthesia Postprocedure Evaluation (Signed)
  Anesthesia Post-op Note  Patient: Angela Ashley  Procedure(s) Performed: Procedure(s): Primary CESAREAN SECTION of baby girl  at  2051  APGAR 9/9 (N/A)  Patient Location: PACU and Mother/Baby  Anesthesia Type:Spinal  Level of Consciousness: awake, alert  and oriented  Airway and Oxygen Therapy: Patient Spontanous Breathing  Post-op Pain: mild  Post-op Assessment: Patient's Cardiovascular Status Stable, Respiratory Function Stable, Patent Airway, No signs of Nausea or vomiting, Adequate PO intake and Pain level controlled  Post-op Vital Signs: stable  Complications: No apparent anesthesia complications

## 2013-03-28 MED ORDER — VALACYCLOVIR HCL 500 MG PO TABS
1000.0000 mg | ORAL_TABLET | Freq: Every day | ORAL | Status: DC
  Administered 2013-03-28 – 2013-03-29 (×2): 1000 mg via ORAL
  Filled 2013-03-28 (×3): qty 2

## 2013-03-28 NOTE — Progress Notes (Signed)
Patient ID: Angela Ashley, female   DOB: July 12, 1986, 27 y.o.   MRN: [id number] #[id number] afebrile BP normal Tolerating a diet but no flatus. Ambulating well HGB 10.7 to 9.6.

## 2013-03-29 ENCOUNTER — Encounter (HOSPITAL_COMMUNITY): Payer: Self-pay | Admitting: Obstetrics and Gynecology

## 2013-03-29 MED ORDER — OXYCODONE-ACETAMINOPHEN 5-325 MG PO TABS: 1.0000 | ORAL_TABLET | Freq: Four times a day (QID) | ORAL | Status: DC | PRN

## 2013-03-29 MED ORDER — IBUPROFEN 600 MG PO TABS: 600.0000 mg | ORAL_TABLET | Freq: Four times a day (QID) | ORAL | Status: DC | PRN

## 2013-03-29 NOTE — Progress Notes (Signed)
Patient ID: Angela Ashley, female   DOB: 31-Mar-1986, 27 y.o.   MRN: [id number] #3 afebrile no problems for d/c

## 2013-03-30 NOTE — Discharge Summary (Signed)
Angela Ashley, Angela Ashley             ACCOUNT NO.:  [id number]  MEDICAL RECORD NO.:  [id number]  LOCATION:  9147                          [facility]  PHYSICIAN:  Malachi Pro. Ambrose Mantle, M.D. DATE OF BIRTH:  [birth year]  DATE OF ADMISSION:  03/26/2013 DATE OF DISCHARGE:  03/29/2013                              DISCHARGE SUMMARY   HISTORY OF PRESENT ILLNESS:  A 27 year old black female, para 1-0-1, gravida 2 with [facility] Apr 02, 2013, admitted with premature rupture of the membranes, and still an active herpes lesion.  The patient had been treated for herpes lesion for some time that lesion failed to resolve. She is now admitted with premature rupture of the membranes, and will be taken to the operating room for C-section.  The patient underwent a low- transverse cervical C-section by Dr. Ambrose Mantle under spinal anesthesia with delivery of a healthy female infant.  Postpartum, the patient did quite well and was discharged on the third postpartum day, tolerating a regular diet, ambulating well without difficulty, voiding well, and remaining afebrile.  LABORATORY DATA:  Initial hemoglobin was 10.7, hematocrit 33.0, white count 12200, platelet count 188,000.  Followup hemoglobin 9.6, hematocrit 29.5, white count 14,100.  FINAL DIAGNOSES:  Intrauterine pregnancy, 39 weeks, active labor with active herpes lesion on her left lower vulva.  OPERATION:  Low-transverse cervical cesarean section.  FINAL CONDITION:  Improved.  INSTRUCTIONS:  Include our regular discharge instruction booklet as well as our after visit summary.  She is given a prescription for Motrin 600 mg 30 tablets, 1 every 6 hours as needed for pain and Percocet 5/325, 30 tablets, 1 every 6 hours as needed for pain.  She is also advised to take ferrous sulfate 325 mg twice daily as well as the prenatal vitamins.  She is asked to return to the office in about 1 week to have her staples removed.     Malachi Pro. Ambrose Mantle,  M.D.     TFH/MEDQ  D:  03/29/2013  T:  03/30/2013  Job:  [id number]

## 2013-09-19 ENCOUNTER — Encounter: Payer: Self-pay | Admitting: Family

## 2013-09-19 ENCOUNTER — Ambulatory Visit (INDEPENDENT_AMBULATORY_CARE_PROVIDER_SITE_OTHER): Payer: BC Managed Care – PPO | Admitting: Family

## 2013-09-19 VITALS — BP 104/60 | HR 88 | Ht 59.5 in | Wt 99.0 lb

## 2013-09-19 DIAGNOSIS — R634 Abnormal weight loss: Secondary | ICD-10-CM

## 2013-09-19 DIAGNOSIS — R630 Anorexia: Secondary | ICD-10-CM

## 2013-09-19 LAB — CBC WITH DIFFERENTIAL/PLATELET
Basophils Relative: 0.6 % (ref 0.0–3.0)
Eosinophils Absolute: 0.3 10*3/uL (ref 0.0–0.7)
Eosinophils Relative: 3.6 % (ref 0.0–5.0)
Lymphocytes Relative: 30.7 % (ref 12.0–46.0)
MCHC: 33.3 g/dL (ref 30.0–36.0)
MCV: 76.6 fl — ABNORMAL LOW (ref 78.0–100.0)
Monocytes Absolute: 0.6 10*3/uL (ref 0.1–1.0)
Neutrophils Relative %: 57.6 % (ref 43.0–77.0)
Platelets: 280 10*3/uL (ref 150.0–400.0)
RBC: 4.94 Mil/uL (ref 3.87–5.11)
WBC: 7.8 10*3/uL (ref 4.5–10.5)

## 2013-09-19 NOTE — Progress Notes (Signed)
Subjective:    Patient ID: Angela Ashley, female    DOB: 1986-04-01, 27 y.o.   MRN: [id number]  HPI  27 year old the patient to the practice and to be established. She has concerns of weight loss and decreased appetite has been ongoing over the past 6 months. She noticed the difference after giving birth to her child. Her baseline weight is 115 pounds. She is 99 pounds today. Denies any family history of any thyroid disorder. No palpitations, heat or cold intolerance.  Review of Systems  Constitutional: Positive for unexpected weight change.  HENT: Negative.   Respiratory: Negative.   Cardiovascular: Negative.   Gastrointestinal: Negative.   Endocrine: Negative.   Genitourinary: Negative.   Musculoskeletal: Negative.   Skin: Negative.   Neurological: Negative.   Hematological: Negative.   Psychiatric/Behavioral: Negative.    Past Medical History  Diagnosis Date  . Abnormal Pap smear   . Bacterial vaginosis     reoccuring  . Chlamydia   . Herpes     History   Social History  . Marital Status: Married    Spouse Name: N/A    Number of Children: N/A  . Years of Education: N/A   Occupational History  . Not on file.   Social History Main Topics  . Smoking status: Former Smoker    Types: Cigarettes  . Smokeless tobacco: Never Used  . Alcohol Use: No     Comment: Occassional use before pregnancy  . Drug Use: No  . Sexual Activity: Yes    Birth Control/ Protection: None   Other Topics Concern  . Not on file   Social History Narrative  . No narrative on file    Past Surgical History  Procedure Laterality Date  . Colposcopy w/ biopsy / curettage      Dr. in [place]  . Cesarean section N/A 03/26/2013    Procedure: Primary CESAREAN SECTION of baby girl  at  2051  APGAR 9/9;  Surgeon: Bing Plume, MD;  Location: [facility];  Service: Obstetrics;  Laterality: N/A;    Family History  Problem Relation Age of Onset  . Diabetes Paternal Grandmother     No  Known Allergies  Current Outpatient Prescriptions on File Prior to Visit  Medication Sig Dispense Refill  . ibuprofen (ADVIL,MOTRIN) 600 MG tablet Take 1 tablet (600 mg total) by mouth every 6 (six) hours as needed for pain.  30 tablet  0  . oxyCODONE-acetaminophen (PERCOCET/ROXICET) 5-325 MG per tablet Take 1-2 tablets by mouth every 6 (six) hours as needed.  30 tablet  0  . Prenatal Vit-Fe Fumarate-FA (PRENATAL MULTIVITAMIN) TABS Take 1 tablet by mouth at bedtime.       No current facility-administered medications on file prior to visit.    BP 104/60  Pulse 88  Ht 4' 11.5" (1.511 m)  Wt 99 lb (44.906 kg)  BMI 19.67 kg/m2chart    Objective:   Physical Exam  Constitutional: She is oriented to person, place, and time. She appears well-developed and well-nourished.  HENT:  Right Ear: External ear normal.  Left Ear: External ear normal.  Nose: Nose normal.  Mouth/Throat: Oropharynx is clear and moist.  Neck: Normal range of motion. Neck supple.  Cardiovascular: Normal rate, regular rhythm and normal heart sounds.   Pulmonary/Chest: Effort normal and breath sounds normal.  Abdominal: Soft. Bowel sounds are normal.  Musculoskeletal: Normal range of motion.  Neurological: She is alert and oriented to person, place, and time.  Skin: Skin is  warm and dry.  Psychiatric: She has a normal mood and affect.          Assessment & Plan:   assessment: 1. Weight loss 2. Decreased appetite  Plan: CBC and TSH that were patient and the results. Consider Remeron if her labs are stable. Call the office with any questions or concerns. Recheck any labs or sooner if needed.

## 2013-09-20 ENCOUNTER — Other Ambulatory Visit: Payer: Self-pay | Admitting: Family

## 2013-09-20 MED ORDER — MIRTAZAPINE 15 MG PO TABS: 15.0000 mg | ORAL_TABLET | Freq: Every day | ORAL | Status: DC

## 2014-03-24 ENCOUNTER — Encounter: Payer: Self-pay | Admitting: Family Medicine

## 2014-03-24 ENCOUNTER — Ambulatory Visit (INDEPENDENT_AMBULATORY_CARE_PROVIDER_SITE_OTHER): Payer: 59 | Admitting: Family Medicine

## 2014-03-24 VITALS — BP 120/80 | HR 125 | Temp 98.4°F | Ht 59.5 in | Wt 114.0 lb

## 2014-03-24 DIAGNOSIS — R300 Dysuria: Secondary | ICD-10-CM

## 2014-03-24 LAB — POCT URINALYSIS DIPSTICK
BILIRUBIN UA: NEGATIVE
Glucose, UA: NEGATIVE
KETONES UA: NEGATIVE
NITRITE UA: NEGATIVE
PH UA: 7
Protein, UA: NEGATIVE
RBC UA: NEGATIVE
Spec Grav, UA: 1.01
Urobilinogen, UA: 0.2

## 2014-03-24 MED ORDER — CIPROFLOXACIN HCL 250 MG PO TABS: 250.0000 mg | ORAL_TABLET | Freq: Two times a day (BID) | ORAL | Status: DC

## 2014-03-24 NOTE — Progress Notes (Addendum)
No chief complaint on file.   HPI:  Acute visit for Dysuria: -started 2 weeks ago -symptoms: mild burning with urination, frequency, urgency -denies: fevers, chills, vomiting, nausea, flank pain, gross hematuria, vaginal symptoms -FDLMP: on nexplanon, no periods -reports hx of UTI  ROS: See pertinent positives and negatives per HPI.  Past Medical History  Diagnosis Date  . Abnormal Pap smear   . Bacterial vaginosis     reoccuring  . Chlamydia   . Herpes     Past Surgical History  Procedure Laterality Date  . Colposcopy w/ biopsy / curettage      [place]  . Cesarean section N/A 03/26/2013    Procedure: Primary CESAREAN SECTION of baby girl  at  2051  APGAR 9/9;  Surgeon: Bing Plumehomas F Henley, MD;  Location: [facility];  Service: Obstetrics;  Laterality: N/A;    Family History  Problem Relation Age of Onset  . Diabetes Paternal Grandmother     History   Social History  . Marital Status: Married    Spouse Name: N/A    Number of Children: N/A  . Years of Education: N/A   Social History Main Topics  . Smoking status: Former Smoker    Types: Cigarettes  . Smokeless tobacco: Never Used  . Alcohol Use: No     Comment: Occassional use before pregnancy  . Drug Use: No  . Sexual Activity: Yes    Birth Control/ Protection: None   Other Topics Concern  . None   Social History Narrative  . None    Current outpatient prescriptions:ciprofloxacin (CIPRO) 250 MG tablet, Take 1 tablet (250 mg total) by mouth 2 (two) times daily., Disp: 6 tablet, Rfl: 0;  mirtazapine (REMERON) 15 MG tablet, Take 1 tablet (15 mg total) by mouth at bedtime., Disp: 30 tablet, Rfl: 1;  nitrofurantoin, macrocrystal-monohydrate, (MACROBID) 100 MG capsule, Take 1 capsule (100 mg total) by mouth 2 (two) times daily., Disp: 14 capsule, Rfl: 0 Prenatal Vit-Fe Fumarate-FA (PRENATAL MULTIVITAMIN) TABS, Take 1 tablet by mouth at bedtime., Disp: , Rfl:   EXAM:  Filed Vitals:   03/24/14 1408  BP:  120/80  Pulse: 125  Temp: 98.4 F (36.9 C)    Body mass index is 22.65 kg/(m^2).  GENERAL: vitals reviewed and listed above, alert, oriented, appears well hydrated and in no acute distress  HEENT: atraumatic, conjunttiva clear, no obvious abnormalities on inspection of external nose and ears  NECK: no obvious masses on inspection  LUNGS: clear to auscultation bilaterally, no wheezes, rales or rhonchi, good air movement  CV: HRRR, no peripheral edema  MS: moves all extremities without noticeable abnormality  PSYCH: pleasant and cooperative, no obvious depression or anxiety  ASSESSMENT AND PLAN:  Discussed the following assessment and plan:  Dysuria - Plan: POCT urinalysis dipstick, ciprofloxacin (CIPRO) 250 MG tablet, Urine culture, nitrofurantoin, macrocrystal-monohydrate, (MACROBID) 100 MG capsule, CANCELED: CULTURE, URINE COMPREHENSIVE, DISCONTINUED: ciprofloxacin (CIPRO) 250 MG tablet, CANCELED: Urine culture  -leuks on dip, otherwise ok, she has been doing azo and cranberry juice, discussed options andshe wishes to do empiric tx with cipro (disucssed risks) and obtain culture  -Patient advised to return or notify a doctor immediately if symptoms worsen or persist or new concerns arise.  Patient Instructions  -As we discussed, we have prescribed a new medication for you at this appointment. We discussed the common and serious potential adverse effects of this medication and you can review these and more with the pharmacist when you pick up your  medication.  Please follow the instructions for use carefully and notify us immediately if you have any problems taking this medication.  -follow up as needed     Terressa KoyanagiHannah R. Hilbert Briggs

## 2014-03-24 NOTE — Addendum Note (Signed)
Addended by: Rita OharaHRASHER, Kitara Hebb R on: 03/24/2014 02:44 PM   Modules accepted: Orders

## 2014-03-24 NOTE — Addendum Note (Signed)
Addended by: Rita OharaHRASHER, Diaz Crago R on: 03/24/2014 02:46 PM   Modules accepted: Orders

## 2014-03-24 NOTE — Patient Instructions (Signed)

## 2014-03-24 NOTE — Progress Notes (Signed)
Pre visit review using our clinic review tool, if applicable. No additional management support is needed unless otherwise documented below in the visit note. 

## 2014-03-27 LAB — URINE CULTURE: Colony Count: >=100000

## 2014-03-27 MED ORDER — NITROFURANTOIN MONOHYD MACRO 100 MG PO CAPS: 100.0000 mg | ORAL_CAPSULE | Freq: Two times a day (BID) | ORAL | Status: DC

## 2014-03-27 NOTE — Addendum Note (Signed)
Addended by: Terressa KoyanagiKIM, Zonnique Norkus R on: 03/27/2014 11:11 AM   Modules accepted: Orders

## 2014-04-25 ENCOUNTER — Telehealth: Payer: Self-pay | Admitting: Family

## 2014-04-25 NOTE — Telephone Encounter (Signed)
Pt returning your call. Pt states ok to leave message about results.

## 2014-04-26 NOTE — Telephone Encounter (Signed)
See result note - please leave message for her. Thanks. Urine infection resistant to cipro and needed different abx - sent to pharmacy. Advise to see her doctor if worsening or persistent symptoms after treatment.

## 2014-04-26 NOTE — Telephone Encounter (Signed)
I left a detailed message with the results at the pts home number. 

## 2014-07-26 ENCOUNTER — Encounter: Payer: Self-pay | Admitting: Physician Assistant

## 2014-07-26 ENCOUNTER — Ambulatory Visit (INDEPENDENT_AMBULATORY_CARE_PROVIDER_SITE_OTHER): Payer: 59 | Admitting: Physician Assistant

## 2014-07-26 VITALS — BP 100/70 | HR 60 | Temp 98.4°F | Resp 18 | Wt 115.0 lb

## 2014-07-26 DIAGNOSIS — L218 Other seborrheic dermatitis: Secondary | ICD-10-CM

## 2014-07-26 DIAGNOSIS — L219 Seborrheic dermatitis, unspecified: Secondary | ICD-10-CM

## 2014-07-26 MED ORDER — DERMA-SMOOTHE/FS BODY 0.01 % EX OIL: TOPICAL_OIL | CUTANEOUS | Status: DC

## 2014-07-26 NOTE — Patient Instructions (Addendum)
Derma-smoothe Applied to hair as directed.  Contact Dr. Linton Rump McMichael's office at [phone] to schedule a new pt appointment.   If emergency symptoms discussed during visit developed, seek medical attention immediately.  Followup as needed, or for worsening or persistent symptoms despite treatment.     Seborrheic Dermatitis Seborrheic dermatitis involves pink or red skin with greasy, flaky scales. It often occurs where there are more oil (sebaceous) glands. This condition is also known as dandruff. When this condition affects a baby's scalp, it is called cradle cap. It may come and go for no known reason. It can occur at any time of life from infancy to old age. TREATMENT  Cortisone (steroid) ointments, creams, and lotions can help decrease inflammation.  Babies can be treated with baby oil to soften the scales, then they may be washed with baby shampoo. If this does not help, a prescription topical steroid medicine may work.  Adults can use medicated shampoos.  Your caregiver may prescribe corticosteroid cream and shampoo containing an antifungal or yeast medicine (ketoconazole). Hydrocortisone or anti-yeast cream can be rubbed directly into seborrheic dermatitis patches. Yeast does not cause seborrheic dermatitis, but it seems to add to the problem.  In infants, do not aggressively remove the scales or flakes on the scalp with a comb or by other means. This may lead to hair loss. SEEK MEDICAL CARE IF:   The problem does not improve from the medicated shampoos, lotions, or other medicines given by your caregiver.  You have any other questions or concerns. Document Released: 12/25/2004 Document Revised: 05/18/2012 Document Reviewed: 04/08/2010 [facility] Patient Information 2015 [place], [place]. This information is not intended to replace advice given to you by your health care provider. Make sure you discuss any questions you have with your health care provider.

## 2014-07-26 NOTE — Progress Notes (Signed)
Pre visit review using our clinic review tool, if applicable. No additional management support is needed unless otherwise documented below in the visit note. 

## 2014-07-26 NOTE — Progress Notes (Signed)
Subjective:    Patient ID: Angela Ashley, female    DOB: June 27, 1986, 28 y.o.   MRN: [id number]  HPI Patient is a 28 y.o. female presenting for hair loss. Pt states that she has been noticing hair loss for the past 2 years, seems to be gradually worsening. She thinks this is spreading around her scalp. She was worried that she may have ring worm. She states that this itches a lot, and she has noticed some scaling. She denies any draining and no swelling. She denies tenderness to palpation. She has tried cutting her hair off to try to let her hair grow out evenly, and has tried topical vitamin E, however neither of these have helped. Patient denies fevers, chills, nausea, vomiting, diarrhea, shortness of breath, chest pain, headache, syncope.    Review of Systems As per HPI and are otherwise negative.   Past Medical History  Diagnosis Date  . Abnormal Pap smear   . Bacterial vaginosis     reoccuring  . Chlamydia   . Herpes     History   Social History  . Marital Status: Married    Spouse Name: N/A    Number of Children: N/A  . Years of Education: N/A   Occupational History  . Not on file.   Social History Main Topics  . Smoking status: Former Smoker    Types: Cigarettes  . Smokeless tobacco: Never Used  . Alcohol Use: No     Comment: Occassional use before pregnancy  . Drug Use: No  . Sexual Activity: Yes    Birth Control/ Protection: None   Other Topics Concern  . Not on file   Social History Narrative  . No narrative on file    Past Surgical History  Procedure Laterality Date  . Colposcopy w/ biopsy / curettage      Dr. in [place]  . Cesarean section N/A 03/26/2013    Procedure: Primary CESAREAN SECTION of baby girl  at  2051  APGAR 9/9;  Surgeon: Bing Plume, MD;  Location: [facility];  Service: Obstetrics;  Laterality: N/A;    Family History  Problem Relation Age of Onset  . Diabetes Paternal Grandmother     No Known Allergies  No current  outpatient prescriptions on file prior to visit.   No current facility-administered medications on file prior to visit.    EXAM: BP 100/70  Pulse 60  Temp(Src) 98.4 F (36.9 C) (Oral)  Resp 18  Wt 115 lb (52.164 kg)  Breastfeeding? No     Objective:   Physical Exam  Nursing note and vitals reviewed. Constitutional: She is oriented to person, place, and time. She appears well-developed and well-nourished. No distress.  HENT:  Head: Normocephalic and atraumatic.  Cardiovascular: Normal rate, regular rhythm and intact distal pulses.   Pulmonary/Chest: Effort normal and breath sounds normal. No respiratory distress.  Neurological: She is alert and oriented to person, place, and time.  Skin: Skin is warm and dry. She is not diaphoretic.  Scalp has area of yellow crusting skin, visible broken hair shafts. There is no erythema, no swelling, no ttp, no fluctuance, no excessive warmth.  Psychiatric: She has a normal mood and affect. Her behavior is normal. Judgment and thought content normal.     Lab Results  Component Value Date   WBC 7.8 09/19/2013   HGB 12.6 09/19/2013   HCT 37.8 09/19/2013   PLT 280.0 09/19/2013   GLUCOSE 122* 11/30/2010   ALT 20 11/30/2010  AST 42* 11/30/2010   NA 137 11/30/2010   K 3.5 11/30/2010   CL 107 11/30/2010   CREATININE 0.72 11/30/2010   BUN 3* 11/30/2010   CO2 19 11/30/2010   TSH 0.85 09/19/2013        Assessment & Plan:  Angela Ashley was seen today for alopecia.  Diagnoses and associated orders for this visit:  Seborrheic dermatitis of scalp Comments: Trial Derma-smoothe. Will have pt call to schedule with Amy McMichael. - Fluocinolone Acetonide (DERMA-SMOOTHE/FS BODY) 0.01 % OIL; Apply no more than 1 ounce to scalp once daily. Work in thoroughly to hair for about 5 minutes, and then rinse scalp thoroughly with water.    Spoke with Orvan Falconer FNP regarding patient, and I agree with her that this is most likely seborrheic dermatitis, and  I will take her recommendation of trying Derma-Smoothe and having patient call Amy McMichael for appointment. Pt is amenable to this plan.  Return precautions provided, and patient handout on seborrheic dermatitis.  Plan to follow up as needed, or for worsening or persistent symptoms despite treatment.  Patient Instructions  Derma-smoothe Applied to hair as directed.  Contact Dr. Linton Rump McMichael's office at [phone] to schedule a new pt appointment.   If emergency symptoms discussed during visit developed, seek medical attention immediately.  Followup as needed, or for worsening or persistent symptoms despite treatment.

## 2014-08-04 ENCOUNTER — Telehealth: Payer: Self-pay | Admitting: Family

## 2014-08-04 NOTE — Telephone Encounter (Signed)
Pt states the med you rx her is too expensive, need alternate to Fluocinolone Acetonide (DERMA-SMOOTHE/FS BODY) 0.01 % OIL Pt states pharm gave her the name of another, but she cannot find info it started w/ [address] Kitchen [facility]/ [address]

## 2014-08-08 NOTE — Telephone Encounter (Signed)
Per Oran Rein pt should see dermatology to be evaluated.  Left a detailed message for pt at designated cell phone number.

## 2014-08-08 NOTE — Telephone Encounter (Signed)
Pls advise.  

## 2014-08-08 NOTE — Telephone Encounter (Signed)
Called the pharmacy and was told that Derma-smooth was $188 and pt wanted something cheaper. Per pharmacy- pt use to have primary and secondary insurance but now she only has a Editor, commissioning and it is still $150.00. Pt has a deductible. nd it will  The other options are just as expensive.  Pls advise.

## 2014-10-02 ENCOUNTER — Encounter: Payer: Self-pay | Admitting: Physician Assistant

## 2015-02-13 ENCOUNTER — Ambulatory Visit (INDEPENDENT_AMBULATORY_CARE_PROVIDER_SITE_OTHER): Payer: 59 | Admitting: Family Medicine

## 2015-02-13 ENCOUNTER — Encounter: Payer: Self-pay | Admitting: Family Medicine

## 2015-02-13 ENCOUNTER — Other Ambulatory Visit (HOSPITAL_COMMUNITY)
Admission: RE | Admit: 2015-02-13 | Discharge: 2015-02-13 | Disposition: A | Discharge status: Home / Self Care | Payer: 59 | Source: Ambulatory Visit | Attending: Family Medicine | Admitting: Family Medicine

## 2015-02-13 VITALS — BP 92/62 | HR 84 | Temp 98.3°F | Ht 59.5 in | Wt 113.0 lb

## 2015-02-13 DIAGNOSIS — Z113 Encounter for screening for infections with a predominantly sexual mode of transmission: Secondary | ICD-10-CM

## 2015-02-13 DIAGNOSIS — N760 Acute vaginitis: Secondary | ICD-10-CM | POA: Diagnosis present

## 2015-02-13 DIAGNOSIS — N898 Other specified noninflammatory disorders of vagina: Secondary | ICD-10-CM

## 2015-02-13 DIAGNOSIS — Z23 Encounter for immunization: Secondary | ICD-10-CM

## 2015-02-13 DIAGNOSIS — Z3200 Encounter for pregnancy test, result unknown: Secondary | ICD-10-CM

## 2015-02-13 LAB — POCT URINE PREGNANCY: PREG TEST UR: NEGATIVE

## 2015-02-13 NOTE — Addendum Note (Signed)
Addended by: Johnella MoloneyFUNDERBURK, JO A on: 02/13/2015 09:54 AM   Modules accepted: Orders

## 2015-02-13 NOTE — Patient Instructions (Signed)
BEFORE YOU LEAVE: -influenza -Tdap -labs -urine pregnancy test -make sure the front des has a good number where we can contact you regarding your labs  -We have ordered labs or studies at this visit.  It can take up to 1-2 weeks for results and processing. We will contact you with instructions IF your results are abnormal. Normal results will be released to your [facility]. If you have not heard from us or can not find your results in [facility] in 2 weeks please contact our office.

## 2015-02-13 NOTE — Progress Notes (Signed)
Pre visit review using our clinic review tool, if applicable. No additional management support is needed unless otherwise documented below in the visit note. 

## 2015-02-13 NOTE — Progress Notes (Signed)
HPI:  Vaginal Discharge: -reports hx of BV on and off -has had a little discharge the last few weeks with a little odor -reports a few flares of BV per year -she wants to do STI testing as well -sexually active: one partner, female - no know STI exposure -FDLMP: has nexplanon, so rarely get periods, gyn is Dr. Ambrose MantleHenley -denies: fevers, abnormal, abd/pelvic pain, dysuria, weight loss, malaise  ROS: See pertinent positives and negatives per HPI.  Past Medical History  Diagnosis Date  . Abnormal Pap smear   . Bacterial vaginosis     reoccuring  . Chlamydia   . Herpes     Past Surgical History  Procedure Laterality Date  . Colposcopy w/ biopsy / curettage      [place]  . Cesarean section N/A 03/26/2013    Procedure: Primary CESAREAN SECTION of baby girl  at  2051  APGAR 9/9;  Surgeon: Bing Plumehomas F Henley, MD;  Location: [facility];  Service: Obstetrics;  Laterality: N/A;    Family History  Problem Relation Age of Onset  . Diabetes Paternal Grandmother     History   Social History  . Marital Status: Married    Spouse Name: N/A  . Number of Children: N/A  . Years of Education: N/A   Social History Main Topics  . Smoking status: Former Smoker    Types: Cigarettes  . Smokeless tobacco: Never Used  . Alcohol Use: No     Comment: Occassional use before pregnancy  . Drug Use: No  . Sexual Activity: Yes    Birth Control/ Protection: None   Other Topics Concern  . None   Social History Narrative     Current outpatient prescriptions:  .  etonogestrel (NEXPLANON) 68 MG IMPL implant, 1 each by Subdermal route once., Disp: , Rfl:   EXAM:  Filed Vitals:   02/13/15 0909  BP: 92/62  Pulse: 84  Temp: 98.3 F (36.8 C)    Body mass index is 22.45 kg/(m^2).  GENERAL: vitals reviewed and listed above, alert, oriented, appears well hydrated and in no acute distress  HEENT: atraumatic, conjunttiva clear, no obvious abnormalities on inspection of external nose and  ears  NECK: no obvious masses on inspection  ABD: soft, NTTP  GU: normal appearance of ext genitalia, vaginal tissues and cervix, no abnormal discharge or lesions noted, no CMT  MS: moves all extremities without noticeable abnormality  PSYCH: pleasant and cooperative, no obvious depression or anxiety  ASSESSMENT AND PLAN:  Discussed the following assessment and plan:  Vaginal discharge  Routine screening for STI (sexually transmitted infection)  -upreg, CG/Chlam, trich, yeast, BV, HIV, RPR testing -treatment options for BV discussed and she prefer PV if needed, risks discussed -safe sex advised -Tdap and influenza vaccines today -Patient advised to return or notify a doctor immediately if symptoms worsen or persist or new concerns arise.  Patient Instructions  BEFORE YOU LEAVE: -influenza -Tdap -labs -urine pregnancy test -make sure the front des has a good number where we can contact you regarding your labs  -We have ordered labs or studies at this visit.  It can take up to 1-2 weeks for results and processing. We will contact you with instructions IF your results are abnormal. Normal results will be released to your [facility]. If you have not heard from us or can not find your results in [facility] in 2 weeks please contact our office.  Colin Benton R.

## 2015-02-14 LAB — HIV ANTIBODY (ROUTINE TESTING W REFLEX): HIV 1&2 Ab, 4th Generation: NONREACTIVE

## 2015-02-14 LAB — RPR

## 2015-02-15 LAB — CERVICOVAGINAL ANCILLARY ONLY
Chlamydia: NEGATIVE
NEISSERIA GONORRHEA: NEGATIVE
Trichomonas: NEGATIVE

## 2015-02-16 LAB — CERVICOVAGINAL ANCILLARY ONLY
BACTERIAL VAGINITIS: POSITIVE — AB
BACTERIAL VAGINITIS: POSITIVE — AB
Bacterial vaginitis: POSITIVE — AB
Bacterial vaginitis: POSITIVE — AB
CANDIDA VAGINITIS: NEGATIVE

## 2015-02-20 MED ORDER — METRONIDAZOLE 0.75 % VA GEL: VAGINAL | Status: DC

## 2015-02-20 NOTE — Addendum Note (Signed)
Addended by: Johnella MoloneyFUNDERBURK, Delray Reza A on: 02/20/2015 02:54 PM   Modules accepted: Orders

## 2015-02-26 ENCOUNTER — Telehealth: Payer: Self-pay | Admitting: *Deleted

## 2015-02-26 ENCOUNTER — Encounter: Payer: Self-pay | Admitting: *Deleted

## 2015-02-26 NOTE — Telephone Encounter (Signed)
Note completed and the pt is aware this was left at the front desk for her to pick up.

## 2015-02-26 NOTE — Telephone Encounter (Signed)
Please write letter:  "I have not seen this patient recently for any medical issues requiring a work restriction."   Thanks.

## 2015-02-26 NOTE — Telephone Encounter (Signed)
Patient walked in to the office to request a note stating she does not have any work restrictions.  Please call pt when note is complete.

## 2015-05-01 ENCOUNTER — Ambulatory Visit: Payer: Self-pay | Admitting: Family Medicine

## 2015-06-20 ENCOUNTER — Encounter: Payer: Self-pay | Admitting: Family

## 2015-06-20 ENCOUNTER — Other Ambulatory Visit (HOSPITAL_COMMUNITY)
Admission: RE | Admit: 2015-06-20 | Discharge: 2015-06-20 | Disposition: A | Discharge status: Home / Self Care | Payer: 59 | Source: Ambulatory Visit | Attending: Family | Admitting: Family

## 2015-06-20 ENCOUNTER — Ambulatory Visit (INDEPENDENT_AMBULATORY_CARE_PROVIDER_SITE_OTHER): Payer: 59 | Admitting: Family

## 2015-06-20 VITALS — BP 110/80 | HR 93 | Temp 99.6°F | Wt 111.0 lb

## 2015-06-20 DIAGNOSIS — B9689 Other specified bacterial agents as the cause of diseases classified elsewhere: Secondary | ICD-10-CM | POA: Insufficient documentation

## 2015-06-20 DIAGNOSIS — Z113 Encounter for screening for infections with a predominantly sexual mode of transmission: Secondary | ICD-10-CM

## 2015-06-20 DIAGNOSIS — N760 Acute vaginitis: Secondary | ICD-10-CM | POA: Insufficient documentation

## 2015-06-20 DIAGNOSIS — Z01411 Encounter for gynecological examination (general) (routine) with abnormal findings: Secondary | ICD-10-CM | POA: Insufficient documentation

## 2015-06-20 DIAGNOSIS — A499 Bacterial infection, unspecified: Secondary | ICD-10-CM

## 2015-06-20 DIAGNOSIS — Z124 Encounter for screening for malignant neoplasm of cervix: Secondary | ICD-10-CM

## 2015-06-20 MED ORDER — METRONIDAZOLE 0.75 % VA GEL: 1.0000 | VAGINAL | Status: DC

## 2015-06-20 NOTE — Progress Notes (Signed)
Subjective:    Patient ID: Angela Ashley, female    DOB: 1986-02-07, 29 y.o.   MRN: [id number]  HPI 29 year old, African American female seen today with a complaint of bacterial vaginosis and requests STI testing.  Patient has a past medical history of chronic bacterial vaginosis and chlamydia. She is currently sexually active and reports LMP occuring one month ago.  Her current birth control is Nexplanon implant. She denies discharge or itching, but reports smelling a "foul" odor.  Review of Systems  Genitourinary:       Vaginal odor reported.  All other systems reviewed and are negative.     . Past Medical History  Diagnosis Date  . Abnormal Pap smear   . Bacterial vaginosis     reoccuring  . Chlamydia   . Herpes     History   Social History  . Marital Status: Married    Spouse Name: N/A  . Number of Children: N/A  . Years of Education: N/A   Occupational History  . Not on file.   Social History Main Topics  . Smoking status: Former Smoker    Types: Cigarettes  . Smokeless tobacco: Never Used  . Alcohol Use: No     Comment: Occassional use before pregnancy  . Drug Use: No  . Sexual Activity: Yes    Birth Control/ Protection: None   Other Topics Concern  . Not on file   Social History Narrative    Past Surgical History  Procedure Laterality Date  . Colposcopy w/ biopsy / curettage      Dr. in [place]  . Cesarean section N/A 03/26/2013    Procedure: Primary CESAREAN SECTION of baby girl  at  2051  APGAR 9/9;  Surgeon: Bing Plume, MD;  Location: [facility];  Service: Obstetrics;  Laterality: N/A;    Family History  Problem Relation Age of Onset  . Diabetes Paternal Grandmother     No Known Allergies  Current Outpatient Prescriptions on File Prior to Visit  Medication Sig Dispense Refill  . etonogestrel (NEXPLANON) 68 MG IMPL implant 1 each by Subdermal route once.     No current facility-administered medications on file prior to visit.     BP 110/80 mmHg  Pulse 93  Temp(Src) 99.6 F (37.6 C)  Wt 111 lb (50.349 kg)chart Objective:   Physical Exam  Constitutional: She is oriented to person, place, and time. She appears well-developed and well-nourished.  HENT:  Head: Normocephalic.  Right Ear: External ear normal.  Left Ear: External ear normal.  Nose: Nose normal.  Mouth/Throat: Oropharynx is clear and moist.  Eyes: Conjunctivae and EOM are normal. Pupils are equal, round, and reactive to light.  Neck: Normal range of motion. Neck supple.  Cardiovascular: Normal rate, regular rhythm and normal heart sounds.   Pulmonary/Chest: Effort normal and breath sounds normal.  Abdominal: Soft. Bowel sounds are normal. She exhibits no distension and no mass. There is no tenderness. There is no rebound and no guarding.  Genitourinary: Vaginal discharge found.  Discharge thick white and milky.   Musculoskeletal: Normal range of motion.  Neurological: She is alert and oriented to person, place, and time.  Skin: Skin is warm and dry.  Psychiatric: She has a normal mood and affect. Her behavior is normal. Judgment and thought content normal.          Assessment & Plan:  Ross was seen today for discuss std.  Diagnoses and all orders for this visit:  Screening for malignant neoplasm of cervix Orders: -     Cancel: PAP [[place]] -     PAP [[place]]  Screen for sexually transmitted diseases Orders: -     Cancel: PAP [[place]] -     PAP [[place]]  Bacterial vaginosis  Other orders -     metroNIDAZOLE (METROGEL VAGINAL) 0.75 % vaginal gel; Place 1 Applicatorful vaginally once a week.   1. Reoccurrence of bacterial vaginosis-patient history of STI, color and consistency of vaginal discharge is highly suspicious of a current episode of bacterial vaginosis.  The patient complains of frequent reoccurrences of bacterial vaginosis and therefore prescribed treatment with 0.75% metronidazole once weekly,  for a period of 4-6 months.  Will follow-up with results of PAP and STI testing.

## 2015-06-20 NOTE — Patient Instructions (Signed)
Bacterial Vaginosis Bacterial vaginosis is an infection of the vagina. It happens when too many of certain germs (bacteria) grow in the vagina. HOME CARE  Take your medicine as told by your doctor.  Finish your medicine even if you start to feel better.  Do not have sex until you finish your medicine and are better.  Tell your sex partner that you have an infection. They should see their doctor for treatment.  Practice safe sex. Use condoms. Have only one sex partner. GET HELP IF:  You are not getting better after 3 days of treatment.  You have more grey fluid (discharge) coming from your vagina than before.  You have more pain than before.  You have a fever. MAKE SURE YOU:   Understand these instructions.  Will watch your condition.  Will get help right away if you are not doing well or get worse. Document Released: 08/26/2008 Document Revised: 09/07/2013 Document Reviewed: 06/29/2013 ExitCare Patient Information 2015 [employer]. This information is not intended to replace advice given to you by your health care provider. Make sure you discuss any questions you have with your health care provider.  

## 2015-06-21 LAB — CYTOLOGY - PAP

## 2015-06-21 LAB — CERVICOVAGINAL ANCILLARY ONLY: CANDIDA VAGINITIS: NEGATIVE

## 2015-06-22 ENCOUNTER — Other Ambulatory Visit: Payer: Self-pay | Admitting: Family

## 2015-06-22 DIAGNOSIS — B977 Papillomavirus as the cause of diseases classified elsewhere: Secondary | ICD-10-CM

## 2015-06-22 DIAGNOSIS — IMO0002 Reserved for concepts with insufficient information to code with codable children: Secondary | ICD-10-CM

## 2015-06-25 ENCOUNTER — Other Ambulatory Visit: Payer: Self-pay | Admitting: Obstetrics and Gynecology

## 2015-06-25 ENCOUNTER — Telehealth: Payer: Self-pay | Admitting: Obstetrics and Gynecology

## 2015-06-25 DIAGNOSIS — R87612 Low grade squamous intraepithelial lesion on cytologic smear of cervix (LGSIL): Secondary | ICD-10-CM

## 2015-07-04 ENCOUNTER — Telehealth: Payer: Self-pay | Admitting: Family

## 2015-07-04 NOTE — Telephone Encounter (Signed)
Spoke with pt and pt is aware of pap results.  See result notes. Pt states gynecology has called her as well.

## 2015-07-04 NOTE — Telephone Encounter (Signed)
Pt is return [place] call concerning pap results. Pt states its ok to leave result on her cell

## 2015-08-22 ENCOUNTER — Ambulatory Visit (INDEPENDENT_AMBULATORY_CARE_PROVIDER_SITE_OTHER): Payer: 59 | Admitting: Family Medicine

## 2015-08-22 DIAGNOSIS — R69 Illness, unspecified: Secondary | ICD-10-CM

## 2015-08-22 NOTE — Progress Notes (Signed)
NO SHOW

## 2016-09-08 NOTE — Telephone Encounter (Signed)
Ok to close

## 2017-02-11 ENCOUNTER — Ambulatory Visit (INDEPENDENT_AMBULATORY_CARE_PROVIDER_SITE_OTHER): Payer: BLUE CROSS/BLUE SHIELD | Admitting: Family Medicine

## 2017-02-11 VITALS — BP 126/78 | HR 92 | Temp 98.7°F | Resp 17 | Ht 60.5 in | Wt 116.0 lb

## 2017-02-11 DIAGNOSIS — W57XXXA Bitten or stung by nonvenomous insect and other nonvenomous arthropods, initial encounter: Secondary | ICD-10-CM

## 2017-02-11 DIAGNOSIS — R21 Rash and other nonspecific skin eruption: Secondary | ICD-10-CM

## 2017-02-11 NOTE — Patient Instructions (Addendum)
It was very good to meet you today.  We are going to check some lab work.  We will let know these results.  I think you just had a local reaction to whatever bit you. Once we get the results back for the labwork and everything looks good then you'll be in the clear.  If you start having any fevers or chills otherwise not feeling well, come back to see us.    IF you received an x-ray today, you will receive an invoice from [facility]. Please contact [facility] at [phone] with questions or concerns regarding your invoice.   IF you received labwork today, you will receive an invoice from [facility]. Please contact [facility] at [phone] with questions or concerns regarding your invoice.   Our billing staff will not be able to assist you with questions regarding bills from these companies.  You will be contacted with the lab results as soon as they are available. The fastest way to get your results is to activate your My Chart account. Instructions are located on the last page of this paperwork. If you have not heard from us regarding the results in 2 weeks, please contact this office.

## 2017-02-11 NOTE — Progress Notes (Addendum)
Subjective:    Angela Ashley is a 31 y.o. female who presents to PCP today for insect bite:  1.  Insect bite:  Patient was on a cruise this past weekend when she was bitten by something on Saturday. She was on an [place] on excursion when she saw something brown fall onto her arm. She tried brushing it off had immediate sharp stabbing pain. She wasn't able to see exactly what was. She noted some redness and swelling to her left upper arm in the biceps area which is where she felt the sharp stabbing pain. This continued throughout the day. However was completely resolved the next day. She did not take anything for pain or swelling because she went to see it this would worsen or resolve.     Since then she has been doing well without complaints. No fevers or chills. No fatigue or malaise. No nausea or vomiting. No rashes.  She has not put anything on her arm.   PMH:  She has had a C-section in the past.  She has a history of personal asthma.  No prior allergic reactions.    Family history:  Negative for any allergic reactions or lung disorders.   ROS as above per HPI.  Pertinently, no chest pain, palpitations, SOB  The following portions of the patient's history were reviewed and updated as appropriate: allergies, current medications, past medical history, family and social history, and problem list. Patient is a nonsmoker.    PMH reviewed.  Past Medical History:  Diagnosis Date  . Abnormal Pap smear   . Bacterial vaginosis    reoccuring  . Chlamydia   . Herpes    Past Surgical History:  Procedure Laterality Date  . CESAREAN SECTION N/A 03/26/2013   Procedure: Primary CESAREAN SECTION of baby girl  at  2051  APGAR 9/9;  Surgeon: Bing Plume, MD;  Location: [facility];  Service: Obstetrics;  Laterality: N/A;  . COLPOSCOPY W/ BIOPSY / CURETTAGE     Dr. in [place]    Medications reviewed. Current Outpatient Prescriptions  Medication Sig Dispense Refill  . norethindrone-ethinyl  estradiol-iron (ESTROSTEP FE,TILIA FE,TRI-LEGEST FE) 1-20/1-30/1-35 MG-MCG tablet Take 1 tablet by mouth daily.     No current facility-administered medications for this visit.      Objective:   Physical Exam BP 126/78   Pulse 92   Temp 98.7 F (37.1 C) (Oral)   Resp 17   Ht 5' 0.5" (1.537 m)   Wt 116 lb (52.6 kg)   LMP 01/28/2017 (Approximate)   SpO2 97%   BMI 22.28 kg/m  Gen:  Alert, cooperative patient who appears stated age in no acute distress.  Vital signs reviewed. HEENT: EOMI,  MMM Cardiac:  Regular rate and rhythm without murmur  Pulm:  Clear to auscultation bilaterally with good air movement.  No wheezes or rales noted.   Abd:  Soft/nondistended/nontender.  Skin:  Tattoos noted Left upper bicep region.  She does have a miniscule 1 mm in diameter puncture hole without any surrounding erythema or drainage. No induration. No pain on palpation. Musculoskeletal: Strength is 5 out of 5 bilateral upper extremities Neurovascular: Sensation 5 out of 5 all of her fingers and hand. Distal pulses are +2  No results found for this or any previous visit (from the past 72 hour(s)).  Impression/Plan: 1.  Bug bite: - unclear what caused injury - had topical local reaction which completely resolved a few hours later.  - no further sequelae of  this  - she was still concerned and wanted "basic labs" checked to ensure nothing was wrong.  Will obtain BMET and CBC - reassurance and warning precautions provided.

## 2017-02-12 LAB — COMPREHENSIVE METABOLIC PANEL
A/G RATIO: 1.2 (ref 1.2–2.2)
ALBUMIN: 4.3 g/dL (ref 3.5–5.5)
ALK PHOS: 65 IU/L (ref 39–117)
ALT: 10 IU/L (ref 0–32)
AST: 17 IU/L (ref 0–40)
BILIRUBIN TOTAL: 0.6 mg/dL (ref 0.0–1.2)
BUN / CREAT RATIO: 10 (ref 9–23)
BUN: 8 mg/dL (ref 6–20)
CHLORIDE: 100 mmol/L (ref 96–106)
CO2: 21 mmol/L (ref 18–29)
Calcium: 9.9 mg/dL (ref 8.7–10.2)
Creatinine, Ser: 0.79 mg/dL (ref 0.57–1.00)
GFR calc non Af Amer: 102 mL/min/{1.73_m2} (ref >59)
GFR, EST AFRICAN AMERICAN: 118 mL/min/{1.73_m2} (ref >59)
GLUCOSE: 69 mg/dL (ref 65–99)
Globulin, Total: 3.6 g/dL (ref 1.5–4.5)
POTASSIUM: 4.4 mmol/L (ref 3.5–5.2)
Sodium: 140 mmol/L (ref 134–144)
TOTAL PROTEIN: 7.9 g/dL (ref 6.0–8.5)

## 2017-02-12 LAB — CBC WITH DIFFERENTIAL/PLATELET
BASOS ABS: 0.1 10*3/uL (ref 0.0–0.2)
Basos: 1 %
EOS (ABSOLUTE): 0.2 10*3/uL (ref 0.0–0.4)
Eos: 3 %
HEMOGLOBIN: 14.2 g/dL (ref 11.1–15.9)
Hematocrit: 42.9 % (ref 34.0–46.6)
IMMATURE GRANS (ABS): 0 10*3/uL (ref 0.0–0.1)
Immature Granulocytes: 0 %
LYMPHS: 33 %
Lymphocytes Absolute: 2.6 10*3/uL (ref 0.7–3.1)
MCH: 28.6 pg (ref 26.6–33.0)
MCHC: 33.1 g/dL (ref 31.5–35.7)
MCV: 86 fL (ref 79–97)
MONOCYTES: 8 %
Monocytes Absolute: 0.6 10*3/uL (ref 0.1–0.9)
Neutrophils Absolute: 4.3 10*3/uL (ref 1.4–7.0)
Neutrophils: 55 %
Platelets: 303 10*3/uL (ref 150–379)
RBC: 4.97 x10E6/uL (ref 3.77–5.28)
RDW: 13 % (ref 12.3–15.4)
WBC: 7.7 10*3/uL (ref 3.4–10.8)

## 2017-03-06 ENCOUNTER — Telehealth: Payer: Self-pay | Admitting: Family

## 2017-03-06 NOTE — Telephone Encounter (Signed)
Pt had an appt with dr Selena Batten in sept 2016 and no showed. Pt would like to est with dr Selena Batten. Can I sch?

## 2017-03-06 NOTE — Telephone Encounter (Signed)
Ok to schedule NPV. Advise must arrive 15 minutes prior and if recurrent no shows will not be able to see her. See if she can come this afternoon since we have opening - ok.

## 2017-03-06 NOTE — Telephone Encounter (Signed)
lmom for pt to call back

## 2017-03-10 NOTE — Telephone Encounter (Signed)
Pt has been sch

## 2017-03-12 ENCOUNTER — Encounter: Payer: Self-pay | Admitting: Family Medicine

## 2017-03-12 ENCOUNTER — Ambulatory Visit (INDEPENDENT_AMBULATORY_CARE_PROVIDER_SITE_OTHER): Payer: BLUE CROSS/BLUE SHIELD | Admitting: Family Medicine

## 2017-03-12 VITALS — BP 98/60 | HR 64 | Temp 98.3°F | Ht 60.5 in | Wt 113.4 lb

## 2017-03-12 DIAGNOSIS — Z7689 Persons encountering health services in other specified circumstances: Secondary | ICD-10-CM | POA: Diagnosis not present

## 2017-03-12 DIAGNOSIS — Z0000 Encounter for general adult medical examination without abnormal findings: Secondary | ICD-10-CM | POA: Diagnosis not present

## 2017-03-12 DIAGNOSIS — K219 Gastro-esophageal reflux disease without esophagitis: Secondary | ICD-10-CM | POA: Diagnosis not present

## 2017-03-12 LAB — HDL CHOLESTEROL: HDL: 54.8 mg/dL (ref >39.00)

## 2017-03-12 LAB — HEMOGLOBIN A1C: HEMOGLOBIN A1C: 4.7 % (ref 4.6–6.5)

## 2017-03-12 LAB — CHOLESTEROL, TOTAL: CHOLESTEROL: 177 mg/dL (ref 0–200)

## 2017-03-12 NOTE — Patient Instructions (Signed)
BEFORE YOU LEAVE: -follow up: yearly and as needed -labs  Call your gynecology office today to set up a follow up per their recommendations.  Try the dietary things for the acid reflux and as needed tums or zantac. Follow up if worsening, new concerns or persists.  We have ordered labs or studies at this visit. It can take up to 1-2 weeks for results and processing. IF results require follow up or explanation, we will call you with instructions. Clinically stable results will be released to your [facility]. If you have not heard from [place] or cannot find your results in [facility] in 2 weeks please contact our office at [phone].  If you are not yet signed up for [facility], please consider signing up.   We recommend the following healthy lifestyle for LIFE: 1) Small portions.   Tip: eat off of a salad plate instead of a dinner plate.  Tip: It is ok to feel hungry after a meal - that likely means you ate an appropriate portion.  Tip: if you need more or a snack choose fruits, veggies and/or a handful of nuts or seeds.  2) Eat a healthy clean diet.  * Tip: Avoid (less then 1 serving per week): processed foods, sweets, sweetened drinks, white starches (rice, flour, bread, potatoes, pasta, etc), red meat, fast foods, butter  *Tip: CHOOSE instead   * 5-9 servings per day of fresh or frozen fruits and vegetables (but not corn, potatoes, bananas, canned or dried fruit)   *nuts and seeds, beans   *olives and olive oil   *small portions of lean meats such as fish and white chicken    *small portions of whole grains  3)Get at least 150 minutes of sweaty aerobic exercise per week.  4)Reduce stress - consider counseling, meditation and relaxation to balance other aspects of your life.    Food Choices for Gastroesophageal Reflux Disease, Adult When you have gastroesophageal reflux disease (GERD), the foods you eat and your eating habits are very important. Choosing the right foods can help ease your  discomfort. What guidelines do I need to follow?  Choose fruits, vegetables, whole grains, and low-fat dairy products.  Choose low-fat meat, fish, and poultry.  Limit fats such as oils, salad dressings, butter, nuts, and avocado.  Keep a food diary. This helps you identify foods that cause symptoms.  Avoid foods that cause symptoms. These may be different for everyone.  Eat small meals often instead of 3 large meals a day.  Eat your meals slowly, in a place where you are relaxed.  Limit fried foods.  Cook foods using methods other than frying.  Avoid drinking alcohol.  Avoid drinking large amounts of liquids with your meals.  Avoid bending over or lying down until 2-3 hours after eating. What foods are not recommended? These are some foods and drinks that may make your symptoms worse: Vegetables  Tomatoes. Tomato juice. Tomato and spaghetti sauce. Chili peppers. Onion and garlic. Horseradish. Fruits  Oranges, grapefruit, and lemon (fruit and juice). Meats  High-fat meats, fish, and poultry. This includes hot dogs, ribs, ham, sausage, salami, and bacon. Dairy  Whole milk and chocolate milk. Sour cream. Cream. Butter. Ice cream. Cream cheese. Drinks  Coffee and tea. Bubbly (carbonated) drinks or energy drinks. Condiments  Hot sauce. Barbecue sauce. Sweets/Desserts  Chocolate and cocoa. Donuts. Peppermint and spearmint. Fats and Oils  High-fat foods. This includes [place] fries and potato chips. Other  Vinegar. Strong spices. This includes black pepper, white  pepper, red pepper, cayenne, curry powder, cloves, ginger, and chili powder. The items listed above may not be a complete list of foods and drinks to avoid. Contact your dietitian for more information.  This information is not intended to replace advice given to you by your health care provider. Make sure you discuss any questions you have with your health care provider. Document Released: 05/18/2012 Document Revised:  04/24/2016 Document Reviewed: 09/21/2013 Elsevier Interactive Patient Education  2017 Elsevier Inc.  WE NOW OFFER   [facility] FAST TRACK!!!  SAME DAY Appointments for ACUTE CARE  Such as: Sprains, Injuries, cuts, abrasions, rashes, muscle pain, joint pain, back pain Colds, flu, sore throats, headache, allergies, cough, fever  Ear pain, sinus and eye infections Abdominal pain, nausea, vomiting, diarrhea, upset stomach Animal/insect bites  3 Easy Ways to Schedule: Walk-In Scheduling Call in scheduling Mychart Sign-up: [email or web address]

## 2017-03-12 NOTE — Progress Notes (Signed)
HPI:  Angela Ashley is here to establish care and for her preventive care visit. Goes to [facility] and sees Dr. Ambrose Mantle. Hx of abnormal pap smear. Never had diabetes and lipid screening. Active at job - Psychologist, sport and exercise. Feels eats healthy. Has 29 yo and 29 yo girl. Does take MV. Ok to HIV screening and RPR. No sig FH cancer.   ROS negative for unless reported above: fevers, unintentional weight loss, hearing or vision loss, chest pain, palpitations, struggling to breath, hemoptysis, melena, hematochezia, hematuria, falls, loc, si, thoughts of self harm.  Rare GERD - tums helps happens at night - heartburn. < a few times per month. No dysphagia, vomiting, weight loss, etc.   Past Medical History:  Diagnosis Date  . Abnormal Pap smear   . Bacterial vaginosis    reoccuring  . Chlamydia   . Herpes     Past Surgical History:  Procedure Laterality Date  . CESAREAN SECTION N/A 03/26/2013   Procedure: Primary CESAREAN SECTION of baby girl  at  2051  APGAR 9/9;  Surgeon: Bing Plume, MD;  Location: [facility];  Service: Obstetrics;  Laterality: N/A;  . COLPOSCOPY W/ BIOPSY / CURETTAGE     Dr. in [place]    Family History  Problem Relation Age of Onset  . Diabetes Paternal Grandmother     Social History   Social History  . Marital status: Married    Spouse name: N/A  . Number of children: N/A  . Years of education: N/A   Social History Main Topics  . Smoking status: Former Smoker    Types: Cigarettes  . Smokeless tobacco: Never Used  . Alcohol use No     Comment: Occassional use before pregnancy  . Drug use: No  . Sexual activity: Yes    Birth control/ protection: None   Other Topics Concern  . None   Social History Narrative   Work or School: Special educational needs teacher Situation: lives with children 4 and 6 in 03/2017      Spiritual Beliefs: Christian      Lifestyle: diet is ok other then soda, no regular aerobic exercise      Drinks wine 1 glass q 3  weeks.     Current Outpatient Prescriptions:  .  norethindrone-ethinyl estradiol-iron (ESTROSTEP FE,TILIA FE,TRI-LEGEST FE) 1-20/1-30/1-35 MG-MCG tablet, Take 1 tablet by mouth daily., Disp: , Rfl:   EXAM:  Vitals:   03/12/17 1326  BP: 98/60  Pulse: 64  Temp: 98.3 F (36.8 C)    Body mass index is 21.78 kg/m.  GENERAL: vitals reviewed and listed above, alert, oriented, appears well hydrated and in no acute distress  HEENT: atraumatic, conjunttiva clear, no obvious abnormalities on inspection of external nose and ears  NECK: no obvious masses on inspection  LUNGS: clear to auscultation bilaterally, no wheezes, rales or rhonchi, good air movement  CV: HRRR, no peripheral edema  GU/BREAST: declined - will see gyn  ABD: soft, NTTP  MS: moves all extremities without noticeable abnormality  PSYCH: pleasant and cooperative, no obvious depression or anxiety  ASSESSMENT AND PLAN:  Discussed the following assessment and plan:  Visit for preventive health examination - Plan: Hemoglobin A1c, HDL cholesterol, Cholesterol, total -USPSTF recs for her age discussed -she will see her gynecologist for gyn exam - advised she call today to ensur enot late on f/u given her hx abnormal pap -lifestyle recs -labs per orders  Gastroesophageal reflux disease, esophagitis  presence not specified -mild, rare symptoms -advised lifestyle recs prn OTC options and follow up if worsens or persists  Encounter to establish care: -We reviewed the PMH, PSH, FH, SH, Meds and Allergies. -We provided refills for any medications we will prescribe as needed. -We addressed current concerns per orders and patient instructions.   -Patient advised to return or notify a doctor immediately if symptoms worsen or persist or new concerns arise.  Patient Instructions  BEFORE YOU LEAVE: -follow up: yearly and as needed -labs  Call your gynecology office today to set up a follow up per their  recommendations.  Try the dietary things for the acid reflux and as needed tums or zantac. Follow up if worsening, new concerns or persists.  We have ordered labs or studies at this visit. It can take up to 1-2 weeks for results and processing. IF results require follow up or explanation, we will call you with instructions. Clinically stable results will be released to your [facility]. If you have not heard from [place] or cannot find your results in [facility] in 2 weeks please contact our office at [phone].  If you are not yet signed up for [facility], please consider signing up.   We recommend the following healthy lifestyle for LIFE: 1) Small portions.   Tip: eat off of a salad plate instead of a dinner plate.  Tip: It is ok to feel hungry after a meal - that likely means you ate an appropriate portion.  Tip: if you need more or a snack choose fruits, veggies and/or a handful of nuts or seeds.  2) Eat a healthy clean diet.  * Tip: Avoid (less then 1 serving per week): processed foods, sweets, sweetened drinks, white starches (rice, flour, bread, potatoes, pasta, etc), red meat, fast foods, butter  *Tip: CHOOSE instead   * 5-9 servings per day of fresh or frozen fruits and vegetables (but not corn, potatoes, bananas, canned or dried fruit)   *nuts and seeds, beans   *olives and olive oil   *small portions of lean meats such as fish and white chicken    *small portions of whole grains  3)Get at least 150 minutes of sweaty aerobic exercise per week.  4)Reduce stress - consider counseling, meditation and relaxation to balance other aspects of your life.    Food Choices for Gastroesophageal Reflux Disease, Adult When you have gastroesophageal reflux disease (GERD), the foods you eat and your eating habits are very important. Choosing the right foods can help ease your discomfort. What guidelines do I need to follow?  Choose fruits, vegetables, whole grains, and low-fat dairy  products.  Choose low-fat meat, fish, and poultry.  Limit fats such as oils, salad dressings, butter, nuts, and avocado.  Keep a food diary. This helps you identify foods that cause symptoms.  Avoid foods that cause symptoms. These may be different for everyone.  Eat small meals often instead of 3 large meals a day.  Eat your meals slowly, in a place where you are relaxed.  Limit fried foods.  Cook foods using methods other than frying.  Avoid drinking alcohol.  Avoid drinking large amounts of liquids with your meals.  Avoid bending over or lying down until 2-3 hours after eating. What foods are not recommended? These are some foods and drinks that may make your symptoms worse: Vegetables  Tomatoes. Tomato juice. Tomato and spaghetti sauce. Chili peppers. Onion and garlic. Horseradish. Fruits  Oranges, grapefruit, and lemon (fruit and juice). Meats  High-fat meats, fish, and poultry. This includes hot dogs, ribs, ham, sausage, salami, and bacon. Dairy  Whole milk and chocolate milk. Sour cream. Cream. Butter. Ice cream. Cream cheese. Drinks  Coffee and tea. Bubbly (carbonated) drinks or energy drinks. Condiments  Hot sauce. Barbecue sauce. Sweets/Desserts  Chocolate and cocoa. Donuts. Peppermint and spearmint. Fats and Oils  High-fat foods. This includes [place] fries and potato chips. Other  Vinegar. Strong spices. This includes black pepper, white pepper, red pepper, cayenne, curry powder, cloves, ginger, and chili powder. The items listed above may not be a complete list of foods and drinks to avoid. Contact your dietitian for more information.  This information is not intended to replace advice given to you by your health care provider. Make sure you discuss any questions you have with your health care provider. Document Released: 05/18/2012 Document Revised: 04/24/2016 Document Reviewed: 09/21/2013 Elsevier Interactive Patient Education  2017 Elsevier Inc.  WE NOW  OFFER   [facility] FAST TRACK!!!  SAME DAY Appointments for ACUTE CARE  Such as: Sprains, Injuries, cuts, abrasions, rashes, muscle pain, joint pain, back pain Colds, flu, sore throats, headache, allergies, cough, fever  Ear pain, sinus and eye infections Abdominal pain, nausea, vomiting, diarrhea, upset stomach Animal/insect bites  3 Easy Ways to Schedule: Walk-In Scheduling Call in scheduling Mychart Sign-up: [email or web address]                 Kriste Basque R.

## 2017-03-12 NOTE — Progress Notes (Signed)
Pre visit review using our clinic review tool, if applicable. No additional management support is needed unless otherwise documented below in the visit note. 

## 2017-03-13 LAB — HIV ANTIBODY (ROUTINE TESTING W REFLEX): HIV 1&2 Ab, 4th Generation: NONREACTIVE

## 2017-03-13 LAB — RPR

## 2017-04-03 DIAGNOSIS — Z1389 Encounter for screening for other disorder: Secondary | ICD-10-CM | POA: Diagnosis not present

## 2017-04-03 DIAGNOSIS — Z130 Encounter for screening for diseases of the blood and blood-forming organs and certain disorders involving the immune mechanism: Secondary | ICD-10-CM | POA: Diagnosis not present

## 2017-04-03 DIAGNOSIS — Z124 Encounter for screening for malignant neoplasm of cervix: Secondary | ICD-10-CM | POA: Diagnosis not present

## 2017-04-03 DIAGNOSIS — Z309 Encounter for contraceptive management, unspecified: Secondary | ICD-10-CM | POA: Diagnosis not present

## 2017-04-03 DIAGNOSIS — Z01419 Encounter for gynecological examination (general) (routine) without abnormal findings: Secondary | ICD-10-CM | POA: Diagnosis not present

## 2017-04-03 DIAGNOSIS — R87610 Atypical squamous cells of undetermined significance on cytologic smear of cervix (ASC-US): Secondary | ICD-10-CM | POA: Diagnosis not present

## 2017-04-03 DIAGNOSIS — Z113 Encounter for screening for infections with a predominantly sexual mode of transmission: Secondary | ICD-10-CM | POA: Diagnosis not present

## 2017-04-03 LAB — HM PAP SMEAR

## 2017-05-28 DIAGNOSIS — Z30013 Encounter for initial prescription of injectable contraceptive: Secondary | ICD-10-CM | POA: Diagnosis not present

## 2017-08-20 DIAGNOSIS — Z30013 Encounter for initial prescription of injectable contraceptive: Secondary | ICD-10-CM | POA: Diagnosis not present

## 2017-09-08 ENCOUNTER — Telehealth: Payer: Self-pay | Admitting: *Deleted

## 2017-09-08 NOTE — Telephone Encounter (Signed)
Patient collected tdap record today 09/08/17

## 2018-06-18 ENCOUNTER — Encounter: Payer: Self-pay | Admitting: Family Medicine

## 2018-09-22 DIAGNOSIS — N898 Other specified noninflammatory disorders of vagina: Secondary | ICD-10-CM | POA: Diagnosis not present

## 2018-09-22 DIAGNOSIS — N760 Acute vaginitis: Secondary | ICD-10-CM | POA: Diagnosis not present

## 2018-09-22 DIAGNOSIS — Z113 Encounter for screening for infections with a predominantly sexual mode of transmission: Secondary | ICD-10-CM | POA: Diagnosis not present

## 2018-09-22 DIAGNOSIS — N870 Mild cervical dysplasia: Secondary | ICD-10-CM | POA: Diagnosis not present

## 2018-09-22 DIAGNOSIS — Z3202 Encounter for pregnancy test, result negative: Secondary | ICD-10-CM | POA: Diagnosis not present

## 2018-11-04 ENCOUNTER — Ambulatory Visit (INDEPENDENT_AMBULATORY_CARE_PROVIDER_SITE_OTHER): Payer: 59 | Admitting: Family Medicine

## 2018-11-04 ENCOUNTER — Encounter: Payer: Self-pay | Admitting: Family Medicine

## 2018-11-04 VITALS — BP 100/76 | HR 84 | Temp 98.3°F | Ht 60.5 in | Wt 119.5 lb

## 2018-11-04 DIAGNOSIS — Z862 Personal history of diseases of the blood and blood-forming organs and certain disorders involving the immune mechanism: Secondary | ICD-10-CM | POA: Diagnosis not present

## 2018-11-04 DIAGNOSIS — N912 Amenorrhea, unspecified: Secondary | ICD-10-CM

## 2018-11-04 DIAGNOSIS — K5900 Constipation, unspecified: Secondary | ICD-10-CM

## 2018-11-04 DIAGNOSIS — R1030 Lower abdominal pain, unspecified: Secondary | ICD-10-CM | POA: Diagnosis not present

## 2018-11-04 DIAGNOSIS — R5383 Other fatigue: Secondary | ICD-10-CM

## 2018-11-04 LAB — POCT URINALYSIS DIPSTICK
BILIRUBIN UA: NEGATIVE
Glucose, UA: NEGATIVE
KETONES UA: NEGATIVE
Leukocytes, UA: NEGATIVE
Nitrite, UA: NEGATIVE
PH UA: 7 (ref 5.0–8.0)
Protein, UA: NEGATIVE
SPEC GRAV UA: 1.02 (ref 1.010–1.025)
UROBILINOGEN UA: 0.2 U/dL

## 2018-11-04 LAB — POCT URINE PREGNANCY: Preg Test, Ur: NEGATIVE

## 2018-11-04 NOTE — Patient Instructions (Signed)
BEFORE YOU LEAVE: -urine preg, udip - reflex if needed -lab visit for labs -follow up: 1 month  Call your gynecologist tomorrow.  Mirilax twice daily for 3 days, then daily until soft stools daily for several days. Then start fiber supplement daily to keep stools soft.  We have ordered labs or studies at this visit. It can take up to 1-2 weeks for results and processing. IF results require follow up or explanation, we will call you with instructions. Clinically stable results will be released to your [facility]. If you have not heard from us or cannot find your results in [facility] in 2 weeks please contact our office at [phone].  If you are not yet signed up for [facility], please consider signing up.  I hope you are feeling better soon! Seek care promptly if your symptoms worsen, new concerns arise or you are not improving with treatment.

## 2018-11-04 NOTE — Progress Notes (Signed)
HPI:  Using dictation device. Unfortunately this device frequently misinterprets words/phrases.  Acute visit for multiple issues. Seeing her gynecologist (Dr. Ellyn Hack) for contraception and had leep procedure in October. Has had several changes with stopping depo in august, then transdermal contraception - but did not like it so stopped in September. Since then no period and she wonders if related to the LEEP. FDLMP 08/19/18. For the last 1-2  weeks has had constipation - hard stools, straining with bowels, lower abd pain intermittent and feels more tired then usual. Has had increased stress. She is sexually active with her husband only. Denies any concerns for STIs and reports recently tested. Denies fevers, breast tenderness, nausea, vomiting, chills, dysuria, vaginal discharge, depression, anxiety, melena, hematochezia,diarrhea, hematuria.  ROS: See pertinent positives and negatives per HPI.  Past Medical History:  Diagnosis Date  . Abnormal Pap smear   . Bacterial vaginosis    reoccuring  . Chlamydia   . Herpes     Past Surgical History:  Procedure Laterality Date  . CESAREAN SECTION N/A 03/26/2013   Procedure: Primary CESAREAN SECTION of baby girl  at  2051  APGAR 9/9;  Surgeon: Bing Plume, MD;  Location: [facility];  Service: Obstetrics;  Laterality: N/A;  . COLPOSCOPY W/ BIOPSY / CURETTAGE     Dr. in [place]    Family History  Problem Relation Age of Onset  . Diabetes Paternal Grandmother     SOCIAL HX: see hpi  No current outpatient medications on file.  EXAM:  Vitals:   11/04/18 1702  BP: 100/76  Pulse: 84  Temp: 98.3 F (36.8 C)    Body mass index is 22.95 kg/m.  GENERAL: vitals reviewed and listed above, alert, oriented, appears well hydrated and in no acute distress  HEENT: atraumatic, conjunttiva clear, no obvious abnormalities on inspection of external nose and ears  NECK: no obvious masses on inspection  LUNGS: clear to auscultation  bilaterally, no wheezes, rales or rhonchi, good air movement  CV: HRRR, no peripheral edema  MS: moves all extremities without noticeable abnormality  ABD: BS+, soft, minimal difuse lower abd TTP, no rebound or guarding  PSYCH: pleasant and cooperative, no obvious depression or anxiety  ASSESSMENT AND PLAN:  Discussed the following assessment and plan:  Constipation, unspecified constipation type  Fatigue, unspecified type  Hx of iron deficiency anemia  Lower abdominal pain - Plan: POC Urinalysis Dipstick, Urine Microscopic Only, Culture, Urine  Amenorrhea - Plan: POCT urine pregnancy  -we discussed possible serious and likely etiologies, workup and treatment, treatment risks and return precautions -advised pelvic exam, STI testing, urine test (upreg, dip), labs (CBC, BMP, TSH), treatment constipation. After this discussion, Angela Ashley opted for calling her gynecologist tomorrow about the amenorrhea/pelvic pain, labs per orders, tx for constipation. She reports had sti recently and is not concerned. Also did home urine preg test - did agree to repeat home preg here (neg), urine and she wants lab test for anemia as reports hx of this when pregnant. She agrees to follow up with gyn for pelvic and eval as well. -follow up advised 1 month, sooner as needed -of course, we advised Angela Ashley  to return or notify a doctor immediately if symptoms worsen or persist or new concerns arise.   Patient Instructions  BEFORE YOU LEAVE: -urine preg, udip - reflex if needed -lab visit for labs -follow up: 1 month  Call your gynecologist tomorrow.  Mirilax twice daily for 3 days, then daily until soft stools daily for  several days. Then start fiber supplement daily to keep stools soft.  We have ordered labs or studies at this visit. It can take up to 1-2 weeks for results and processing. IF results require follow up or explanation, we will call you with instructions. Clinically stable results will  be released to your [facility]. If you have not heard from us or cannot find your results in [facility] in 2 weeks please contact our office at [phone].  If you are not yet signed up for [facility], please consider signing up.  I hope you are feeling better soon! Seek care promptly if your symptoms worsen, new concerns arise or you are not improving with treatment.           Terressa KoyanagiHannah R Lexee Brashears, DO

## 2018-11-05 DIAGNOSIS — R1030 Lower abdominal pain, unspecified: Secondary | ICD-10-CM | POA: Diagnosis not present

## 2018-11-05 NOTE — Addendum Note (Signed)
Addended by: Conrad BurlingtonAUSTIN, Arlow Spiers on: 11/05/2018 05:05 PM   Modules accepted: Orders

## 2018-11-07 LAB — TIQ-MISC

## 2018-11-07 LAB — URINE CULTURE
MICRO NUMBER: 91463876
Result:: NO GROWTH
SPECIMEN QUALITY:: ADEQUATE

## 2018-11-08 ENCOUNTER — Telehealth: Payer: Self-pay | Admitting: Family Medicine

## 2018-11-08 NOTE — Telephone Encounter (Signed)
Copied from CRM [id number]. Topic: General - Inquiry >> Nov 08, 2018  1:21 PM Windy KalataMichael, Kortlynn Poust L, NT wrote: Reason for CRM: patient is calling to get her lab results.

## 2018-11-09 ENCOUNTER — Telehealth: Payer: Self-pay | Admitting: *Deleted

## 2018-11-09 NOTE — Telephone Encounter (Signed)
Spoke with pt and advised on lab error. She will be coming back to give urine sample.

## 2018-11-09 NOTE — Telephone Encounter (Signed)
Noted  

## 2018-11-09 NOTE — Telephone Encounter (Signed)
There is another encounter for this, patient already notified.

## 2018-11-16 DIAGNOSIS — R102 Pelvic and perineal pain: Secondary | ICD-10-CM | POA: Diagnosis not present

## 2018-12-21 ENCOUNTER — Encounter: Payer: Self-pay | Admitting: Internal Medicine

## 2018-12-21 ENCOUNTER — Ambulatory Visit (INDEPENDENT_AMBULATORY_CARE_PROVIDER_SITE_OTHER): Payer: 59 | Admitting: Internal Medicine

## 2018-12-21 VITALS — BP 90/60 | HR 94 | Temp 97.8°F | Wt 117.8 lb

## 2018-12-21 DIAGNOSIS — R112 Nausea with vomiting, unspecified: Secondary | ICD-10-CM | POA: Diagnosis not present

## 2018-12-21 DIAGNOSIS — J22 Unspecified acute lower respiratory infection: Secondary | ICD-10-CM | POA: Diagnosis not present

## 2018-12-21 LAB — POC INFLUENZA A&B (BINAX/QUICKVUE)
Influenza A, POC: NEGATIVE
Influenza B, POC: NEGATIVE

## 2018-12-21 NOTE — Patient Instructions (Addendum)
Your chest exam is clear   Most likely viral respiratory infection that needs to run its course but   Stop the metformin for now until better   Supportive care rest at this time antibiotics will not help at this time.   Note for work .   Fu with alarm symptoims  Fever chills etc.

## 2018-12-21 NOTE — Progress Notes (Signed)
Chief Complaint  Patient presents with  . Cough    x 2 days with nasal congestion. Pt coughing up yellow mucous, burning in chest and "wheeze". Pt reports N/V and dizziness last night but not sure if from current symptoms or Metformin medication she started yesterday. Pt states that she feels terrible. Denies ever having a fever.     HPI: Maxwell CaulLashonda T Goodwill 33 y.o. come in forsda PCP APPT NA  See above   Not achy but  Cough   Nose congestion and feels badlu  And then chestin anterior chest and  Worse at night . Nose runny  No fever. Per se  Works in or  Exposures to resp infections in hopsital setting   Hx of asthma but no meds  [place]   On metformin   For one day for missed menses  Per gyne had vomiting  Last night and  Nausea this am   ROS: See pertinent positives and negatives per HPI. No myalgias  Mid chest cough.   [place] from [place]    Past Medical History:  Diagnosis Date  . Abnormal Pap smear   . Bacterial vaginosis    reoccuring  . Chlamydia   . Herpes     Family History  Problem Relation Age of Onset  . Diabetes Paternal Grandmother     Social History   Socioeconomic History  . Marital status: Married    Spouse name: Not on file  . Number of children: Not on file  . Years of education: Not on file  . Highest education level: Not on file  Occupational History  . Not on file  Social Needs  . Financial resource strain: Not on file  . Food insecurity:    Worry: Not on file    Inability: Not on file  . Transportation needs:    Medical: Not on file    Non-medical: Not on file  Tobacco Use  . Smoking status: Former Smoker    Types: Cigarettes  . Smokeless tobacco: Never Used  Substance and Sexual Activity  . Alcohol use: No    Comment: Occassional use before pregnancy  . Drug use: No  . Sexual activity: Yes    Birth control/protection: None  Lifestyle  . Physical activity:    Days per week: Not on file    Minutes per session: Not  on file  . Stress: Not on file  Relationships  . Social connections:    Talks on phone: Not on file    Gets together: Not on file    Attends religious service: Not on file    Active member of club or organization: Not on file    Attends meetings of clubs or organizations: Not on file    Relationship status: Not on file  Other Topics Concern  . Not on file  Social History Narrative   Work or School: Ship brokerfulltime nurse tech      Home Situation: lives with children 4 and 6 in 03/2017      Spiritual Beliefs: Christian      Lifestyle: diet is ok other then soda, no regular aerobic exercise      Drinks wine 1 glass q 3 weeks.    Outpatient Medications Prior to Visit  Medication Sig Dispense Refill  . metFORMIN (GLUCOPHAGE) 500 MG tablet Take as directed    . SPRINTEC 28 0.25-35 MG-MCG tablet As directed     No facility-administered medications prior to visit.  EXAM:  BP 90/60 (BP Location: Left Arm, Patient Position: Sitting, Cuff Size: Normal)   Pulse 94   Temp 97.8 F (36.6 C) (Oral)   Wt 117 lb 12.8 oz (53.4 kg)   SpO2 99%   BMI 22.63 kg/m   Body mass index is 22.63 kg/m.  GENERAL: vitals reviewed and listed above, alert, oriented, appears well hydrated and in no acute distress mildly ill  Congested ocasas cough   Here with 2 children well,  No ntoxic  HEENT: atraumatic, conjunctiva  clear, no obvious abnormalities on inspection of external nose and ears tmx clear  Nares clear to mucoid dc no sinus tenderness OP : no lesion edema or exudate  NECK: no obvious masses on inspection palpation  LUNGS: clear to auscultation bilaterally, no wheezes, rales or rhonchi,  CV: HRRR, no clubbing cyanosis or  peripheral edema nl cap refill  Abdomen:  Sof,t normal bowel sounds without hepatosplenomegaly, no guarding rebound or masses no CVA tenderness MS: moves all extremities without noticeable focal  abnormality PSYCH: pleasant and cooperative, no obvious depression or  anxiety Lab Results  Component Value Date   WBC 7.7 02/11/2017   HGB 14.2 02/11/2017   HCT 42.9 02/11/2017   PLT 303 02/11/2017   GLUCOSE 69 02/11/2017   CHOL 177 03/12/2017   HDL 54.80 03/12/2017   ALT 10 02/11/2017   AST 17 02/11/2017   NA 140 02/11/2017   K 4.4 02/11/2017   CL 100 02/11/2017   CREATININE 0.79 02/11/2017   BUN 8 02/11/2017   CO2 21 02/11/2017   TSH 0.85 09/19/2013   HGBA1C 4.7 03/12/2017   BP Readings from Last 3 Encounters:  12/21/18 90/60  11/04/18 100/76  03/12/17 98/60  rapid flu neg  ASSESSMENT AND PLAN:  Discussed the following assessment and plan:  Acute respiratory infection - Plan: POC Influenza A&B (Binax test)  Nausea and vomiting, intractability of vomiting not specified, unspecified vomiting type - Plan: POC Influenza A&B (Binax test) Most likely viral early.  Uncertain how much metformin contributing  So stopping  . Offered  zofran but she will treat symptomatically  Note for work close observation suspect some mild dehydration  -Patient advised to return or notify health care team  if  new concerns arise.  Patient Instructions  Your chest exam is clear   Most likely viral respiratory infection that needs to run its course but   Stop the metformin for now until better   Supportive care rest at this time antibiotics will not help at this time.   Note for work .   Fu with alarm symptoims  Fever chills etc.   Neta Mends. Timmie Dugue M.D.

## 2019-01-11 DIAGNOSIS — N83201 Unspecified ovarian cyst, right side: Secondary | ICD-10-CM | POA: Diagnosis not present

## 2019-01-17 DIAGNOSIS — L669 Cicatricial alopecia, unspecified: Secondary | ICD-10-CM | POA: Diagnosis not present

## 2019-01-17 DIAGNOSIS — L739 Follicular disorder, unspecified: Secondary | ICD-10-CM | POA: Diagnosis not present

## 2019-06-29 DIAGNOSIS — Z1389 Encounter for screening for other disorder: Secondary | ICD-10-CM | POA: Diagnosis not present

## 2019-06-29 DIAGNOSIS — Z130 Encounter for screening for diseases of the blood and blood-forming organs and certain disorders involving the immune mechanism: Secondary | ICD-10-CM | POA: Diagnosis not present

## 2019-06-29 DIAGNOSIS — Z124 Encounter for screening for malignant neoplasm of cervix: Secondary | ICD-10-CM | POA: Diagnosis not present

## 2019-06-29 DIAGNOSIS — Z01419 Encounter for gynecological examination (general) (routine) without abnormal findings: Secondary | ICD-10-CM | POA: Diagnosis not present

## 2019-06-30 DIAGNOSIS — Z124 Encounter for screening for malignant neoplasm of cervix: Secondary | ICD-10-CM | POA: Diagnosis not present

## 2019-08-10 DIAGNOSIS — Z8619 Personal history of other infectious and parasitic diseases: Secondary | ICD-10-CM | POA: Diagnosis not present

## 2019-08-10 DIAGNOSIS — N926 Irregular menstruation, unspecified: Secondary | ICD-10-CM | POA: Diagnosis not present

## 2019-08-15 DIAGNOSIS — N926 Irregular menstruation, unspecified: Secondary | ICD-10-CM | POA: Diagnosis not present

## 2019-09-26 ENCOUNTER — Encounter (HOSPITAL_COMMUNITY): Payer: Self-pay

## 2019-09-26 ENCOUNTER — Other Ambulatory Visit: Payer: Self-pay

## 2019-09-26 ENCOUNTER — Inpatient Hospital Stay (HOSPITAL_COMMUNITY): Payer: 59

## 2019-09-26 ENCOUNTER — Inpatient Hospital Stay (HOSPITAL_COMMUNITY)
Admission: AD | Admit: 2019-09-26 | Discharge: 2019-09-26 | Disposition: A | Discharge status: Home / Self Care | Payer: 59 | Attending: Obstetrics and Gynecology | Admitting: Obstetrics and Gynecology

## 2019-09-26 DIAGNOSIS — O23591 Infection of other part of genital tract in pregnancy, first trimester: Secondary | ICD-10-CM | POA: Insufficient documentation

## 2019-09-26 DIAGNOSIS — R109 Unspecified abdominal pain: Secondary | ICD-10-CM | POA: Diagnosis not present

## 2019-09-26 DIAGNOSIS — O210 Mild hyperemesis gravidarum: Secondary | ICD-10-CM | POA: Diagnosis not present

## 2019-09-26 DIAGNOSIS — Z3490 Encounter for supervision of normal pregnancy, unspecified, unspecified trimester: Secondary | ICD-10-CM

## 2019-09-26 DIAGNOSIS — N939 Abnormal uterine and vaginal bleeding, unspecified: Secondary | ICD-10-CM | POA: Diagnosis present

## 2019-09-26 DIAGNOSIS — O26899 Other specified pregnancy related conditions, unspecified trimester: Secondary | ICD-10-CM

## 2019-09-26 DIAGNOSIS — B9689 Other specified bacterial agents as the cause of diseases classified elsewhere: Secondary | ICD-10-CM | POA: Insufficient documentation

## 2019-09-26 DIAGNOSIS — Z3A08 8 weeks gestation of pregnancy: Secondary | ICD-10-CM | POA: Diagnosis not present

## 2019-09-26 DIAGNOSIS — O468X1 Other antepartum hemorrhage, first trimester: Secondary | ICD-10-CM

## 2019-09-26 DIAGNOSIS — O26891 Other specified pregnancy related conditions, first trimester: Secondary | ICD-10-CM | POA: Insufficient documentation

## 2019-09-26 DIAGNOSIS — Z7984 Long term (current) use of oral hypoglycemic drugs: Secondary | ICD-10-CM | POA: Insufficient documentation

## 2019-09-26 DIAGNOSIS — N760 Acute vaginitis: Secondary | ICD-10-CM

## 2019-09-26 DIAGNOSIS — O208 Other hemorrhage in early pregnancy: Secondary | ICD-10-CM | POA: Insufficient documentation

## 2019-09-26 DIAGNOSIS — Z3A09 9 weeks gestation of pregnancy: Secondary | ICD-10-CM | POA: Insufficient documentation

## 2019-09-26 DIAGNOSIS — O418X90 Other specified disorders of amniotic fluid and membranes, unspecified trimester, not applicable or unspecified: Secondary | ICD-10-CM | POA: Diagnosis present

## 2019-09-26 DIAGNOSIS — O418X10 Other specified disorders of amniotic fluid and membranes, first trimester, not applicable or unspecified: Secondary | ICD-10-CM

## 2019-09-26 DIAGNOSIS — O34219 Maternal care for unspecified type scar from previous cesarean delivery: Secondary | ICD-10-CM | POA: Insufficient documentation

## 2019-09-26 DIAGNOSIS — Z87891 Personal history of nicotine dependence: Secondary | ICD-10-CM | POA: Diagnosis not present

## 2019-09-26 LAB — URINALYSIS, ROUTINE W REFLEX MICROSCOPIC
Bilirubin Urine: NEGATIVE
Glucose, UA: NEGATIVE mg/dL
Hgb urine dipstick: NEGATIVE
Ketones, ur: 15 mg/dL — AB
Leukocytes,Ua: NEGATIVE
Nitrite: NEGATIVE
Protein, ur: NEGATIVE mg/dL
Specific Gravity, Urine: 1.025 (ref 1.005–1.030)
pH: 6 (ref 5.0–8.0)

## 2019-09-26 LAB — CBC
HCT: 38.3 % (ref 36.0–46.0)
Hemoglobin: 13.3 g/dL (ref 12.0–15.0)
MCH: 29.9 pg (ref 26.0–34.0)
MCHC: 34.7 g/dL (ref 30.0–36.0)
MCV: 86.1 fL (ref 80.0–100.0)
Platelets: 237 10*3/uL (ref 150–400)
RBC: 4.45 MIL/uL (ref 3.87–5.11)
RDW: 12.2 % (ref 11.5–15.5)
WBC: 11.3 10*3/uL — ABNORMAL HIGH (ref 4.0–10.5)
nRBC: 0 % (ref 0.0–0.2)

## 2019-09-26 LAB — WET PREP, GENITAL
Sperm: NONE SEEN
Trich, Wet Prep: NONE SEEN
Yeast Wet Prep HPF POC: NONE SEEN

## 2019-09-26 LAB — HCG, QUANTITATIVE, PREGNANCY: hCG, Beta Chain, Quant, S: 278475 m[IU]/mL — ABNORMAL HIGH (ref <5)

## 2019-09-26 LAB — POCT PREGNANCY, URINE: Preg Test, Ur: POSITIVE — AB

## 2019-09-26 LAB — HIV ANTIBODY (ROUTINE TESTING W REFLEX): HIV Screen 4th Generation wRfx: NONREACTIVE

## 2019-09-26 MED ORDER — SCOPOLAMINE 1 MG/3DAYS TD PT72: 1.0000 | MEDICATED_PATCH | TRANSDERMAL | 0 refills | Status: DC

## 2019-09-26 MED ORDER — METRONIDAZOLE 0.75 % VA GEL: 1.0000 | Freq: Every day | VAGINAL | 0 refills | Status: AC

## 2019-09-26 NOTE — MAU Note (Signed)
.   Angela Ashley is a 33 y.o. at [redacted]w[redacted]d here in MAU reporting: vaginal spotting on and off for a couple of weeks with lower back pain on and off. Denies any pain at this time LMP: 07/31/19 Onset of complaint: ongoing Pain score: 0 Vitals:   09/26/19 1701  BP: 127/83  Pulse: 100  Resp: 18  Temp: 97.8 F (36.6 C)  SpO2: 100%     FHT: Lab orders placed from triage:UPT/UA

## 2019-09-26 NOTE — MAU Provider Note (Addendum)
History     CSN: [id number]  Arrival date and time: 09/26/19 1525   First Provider Initiated Contact with Patient 09/26/19 1726      Chief Complaint  Patient presents with  . Vaginal Bleeding   HPI Angela Ashley is a 33 y.o. G3P2001 at [redacted]w[redacted]d who presents today with intermittent vaginal bleeding for the past 2 weeks. She states occasionally when she wipes she will notice dark brown or light pink blood on the toilet paper. Occasionally it will drip into the toilet, but it does not require the use of a pad. She also notes nausea and vomiting for 1 week. 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 She vomited 5 times yesterday and once today. She has not tried any treatments. Reports back pain as well that began this morning, it does not radiate and she has not tried any treatments.   Reports some diarrhea Denies abdominal pain or vaginal discharge   OB History    Gravida  3   Para  2   Term  2   Preterm      AB      Living  1     SAB      TAB      Ectopic      Multiple      Live Births  1           Past Medical History:  Diagnosis Date  . Abnormal Pap smear   . Bacterial vaginosis    reoccuring  . Chlamydia   . Herpes     Past Surgical History:  Procedure Laterality Date  . CESAREAN SECTION N/A 03/26/2013   Procedure: Primary CESAREAN SECTION of baby girl  at  2051  APGAR 9/9;  Surgeon: Bing Plume, MD;  Location: [facility];  Service: Obstetrics;  Laterality: N/A;  . COLPOSCOPY W/ BIOPSY / CURETTAGE     Dr. in [place]    Family History  Problem Relation Age of Onset  . Diabetes Paternal Grandmother   . Healthy Mother   . Healthy Father     Social History   Tobacco Use  . Smoking status: Former Smoker    Types: Cigarettes  . Smokeless tobacco: Never Used  Substance Use Topics  . Alcohol use: Not Currently    Comment: Occassional use before pregnancy  . Drug use: No    Allergies: No Known  Allergies  Medications Prior to Admission  Medication Sig Dispense Refill Last Dose  . prenatal vitamin w/FE, FA (PRENATAL 1 + 1) 27-1 MG TABS tablet Take 1 tablet by mouth daily at 12 noon.   09/23/2019  . metFORMIN (GLUCOPHAGE) 500 MG tablet Take as directed     . SPRINTEC 28 0.25-35 MG-MCG tablet As directed       Review of Systems  Constitutional: Negative for chills, fatigue and fever.  Gastrointestinal: Positive for diarrhea, nausea and vomiting. Negative for abdominal pain and constipation.  Genitourinary: Positive for vaginal bleeding. Negative for vaginal discharge.  Musculoskeletal: Positive for back pain.   Physical Exam   Blood pressure 127/83, pulse 100, temperature 97.8 F (36.6 C), resp. rate 18, last menstrual period 07/31/2019, SpO2 100 %.  Physical Exam  Constitutional: She is oriented to person, place, and time. She appears well-developed and well-nourished. No distress.  HENT:  Head: Normocephalic.  Eyes: Conjunctivae are normal.  Neck: Normal range of motion.  Cardiovascular: Normal rate.  Respiratory: Effort normal.  GI: Soft. There is no abdominal tenderness.  Genitourinary:    Genitourinary Comments: No CMT or adnexal masses or tenderness on bimanual exam. Scant light brown blood/discharge in vaginal vault. No active bleeding or clots.   Neurological: She is alert and oriented to person, place, and time.  Skin: Skin is warm and dry.   Results for orders placed or performed during the hospital encounter of 09/26/19 (from the past 24 hour(s))  Pregnancy, urine POC     Status: Abnormal   Collection Time: 09/26/19  4:08 PM  Result Value Ref Range   Preg Test, Ur POSITIVE (A) NEGATIVE  Wet prep, genital     Status: Abnormal   Collection Time: 09/26/19  5:45 PM   Specimen: PATH Cytology Cervicovaginal Ancillary Only  Result Value Ref Range   Yeast Wet Prep HPF POC NONE SEEN NONE SEEN   Trich, Wet Prep NONE SEEN NONE SEEN   Clue Cells Wet Prep HPF POC  PRESENT (A) NONE SEEN   WBC, Wet Prep HPF POC MODERATE (A) NONE SEEN   Sperm NONE SEEN   CBC     Status: Abnormal   Collection Time: 09/26/19  5:48 PM  Result Value Ref Range   WBC 11.3 (H) 4.0 - 10.5 K/uL   RBC 4.45 3.87 - 5.11 MIL/uL   Hemoglobin 13.3 12.0 - 15.0 g/dL   HCT 81.138.3 91.436.0 - 78.246.0 %   MCV 86.1 80.0 - 100.0 fL   MCH 29.9 26.0 - 34.0 pg   MCHC 34.7 30.0 - 36.0 g/dL   RDW 95.612.2 21.311.5 - 08.615.5 %   Platelets 237 150 - 400 K/uL   nRBC 0.0 0.0 - 0.2 %    [place] Ob Comp Less 14 Wks  Result Date: 09/26/2019 CLINICAL DATA:  Abdominal pain during first trimester of pregnancy, vaginal bleeding for couple weeks, low back pain; quantitative beta hCG pending EXAM: OBSTETRIC <14 WK ULTRASOUND TECHNIQUE: Transabdominal ultrasound was performed for evaluation of the gestation as well as the maternal uterus and adnexal regions. COMPARISON:  None for this gestation FINDINGS: Intrauterine gestational sac: Present, single Yolk sac:  Present Embryo:  Present Cardiac Activity: Present Heart Rate: 176 bpm CRL:   23.8 mm   9 w 0 d                  US EDC: 04/30/2020 Subchorionic hemorrhage: Small subchronic hemorrhage 1.4 x 2.3 x 1.9 cm Maternal uterus/adnexae: Uterus otherwise unremarkable Nonvisualization of RIGHT ovary. LEFT ovary contains a corpus luteal cyst, otherwise normal appearance. No free fluid or adnexal masses. IMPRESSION: Single live intrauterine gestation at 9 weeks 0 days EGA by crown-rump length. Small subchronic hemorrhage. Nonvisualization of RIGHT ovary Electronically Signed   By: Ulyses SouthwardMark  Boles M.D.   On: 09/26/2019 18:16   MAU Course  Procedures Orders Placed This Encounter  Procedures  . Wet prep, genital  . US OB LESS THAN 14 WEEKS WITH OB TRANSVAGINAL  . Urinalysis, Routine w reflex microscopic  . CBC  . hCG, quantitative, pregnancy  . HIV Antibody (routine testing w rflx)  . Pregnancy, urine POC    MDM - CBC, Ultrasound and hcg to rule out ectopic - wet prep + for clue cells  and WBC - GC/Chlaymdia pending   Assessment and Plan   1. Morning sickness   2. Abdominal pain during pregnancy   3. Intrauterine pregnancy   4. Subchorionic hemorrhage of placenta in first trimester, single or unspecified fetus   5. Bacterial vaginosis  Plan -U/S shows live IUP with CRL of 23.8 and HR of 176 and GA of [redacted]w[redacted]d - small subchorionic hemorrhage seen, dicussed finding with patient and that this could be the source of her bleeding. Warned about increased risk of threatened miscarriage and strict return precautions given - Wet prep shows bacterial vaginosis, will treat with metrogel x5 nights - for nausea and vomiting will give scopolamine patch - all questions answered, pt has appointment for prenatal care at [place] on Nov. 2 - pt discharged in stable condition  Alexa A Kimker 09/26/2019, 5:49 PM    I confirm that I have verified the information documented in the physician assistant student's note and that I have also personally reperformed the history, physical exam and all medical decision making activities of this service and have verified that all service and findings are accurately documented in this student's note.   Note given to OOW tomorrow and return on Wednesday 09/28/2019 with no restrictions.   Laury Deep, CNM 09/26/2019 8:37 PM

## 2019-09-26 NOTE — Discharge Instructions (Signed)
Return to MAU: °· If you have heavier bleeding that soaks through more that 2 pads per hour for an hour or more °· If you bleed so much that you feel like you might pass out or you do pass out °· If you have significant abdominal pain that is not improved with Tylenol 1000 mg  °· If you develop a fever > 100.5 °  °Safe Medications in Pregnancy  ° °Acne: °Benzoyl Peroxide °Salicylic Acid ° °Backache/Headache: °Tylenol: 2 regular strength every 4 hours OR °             2 Extra strength every 6 hours ° °Colds/Coughs/Allergies: °Benadryl (alcohol free) 25 mg every 6 hours as needed °Breath right strips °Claritin °Cepacol throat lozenges °Chloraseptic throat spray °Cold-Eeze- up to three times per day °Cough drops, alcohol free °Flonase (by prescription only) °Guaifenesin °Mucinex °Robitussin DM (plain only, alcohol free) °Saline nasal spray/drops °Sudafed (pseudoephedrine) & Actifed ** use only after [redacted] weeks gestation and if you do not have high blood pressure °Tylenol °Vicks Vaporub °Zinc lozenges °Zyrtec  ° °Constipation: °Colace °Ducolax suppositories °Fleet enema °Glycerin suppositories °Metamucil °Milk of magnesia °Miralax °Senokot °Smooth move tea ° °Diarrhea: °Kaopectate °Imodium A-D ° °*NO pepto Bismol ° °Hemorrhoids: °Anusol °Anusol HC °Preparation H °Tucks ° °Indigestion: °Tums °Maalox °Mylanta °Zantac  °Pepcid ° °Insomnia: °Benadryl (alcohol free) 25mg every 6 hours as needed °Tylenol PM °Unisom, no Gelcaps ° °Leg Cramps: °Tums °MagGel ° °Nausea/Vomiting:  °Bonine °Dramamine °Emetrol °Ginger extract °Sea bands °Meclizine  °Nausea medication to take during pregnancy:  °Unisom (doxylamine succinate 25 mg tablets) Take one tablet daily at bedtime. If symptoms are not adequately controlled, the dose can be increased to a maximum recommended dose of two tablets daily (1/2 tablet in the morning, 1/2 tablet mid-afternoon and one at bedtime). °Vitamin B6 100mg tablets. Take one tablet twice a day (up to 200 mg per  day). ° °Skin Rashes: °Aveeno products °Benadryl cream or 25mg every 6 hours as needed °Calamine Lotion °1% cortisone cream ° °Yeast infection: °Gyne-lotrimin 7 °Monistat 7 ° ° °**If taking multiple medications, please check labels to avoid duplicating the same active ingredients °**take medication as directed on the label °** Do not exceed 4000 mg of tylenol in 24 hours °**Do not take medications that contain aspirin or ibuprofen ° ° ° ° °

## 2019-09-27 ENCOUNTER — Other Ambulatory Visit: Payer: Self-pay | Admitting: Obstetrics and Gynecology

## 2019-09-28 LAB — CERVICOVAGINAL ANCILLARY ONLY
Chlamydia: NEGATIVE
Neisseria Gonorrhea: NEGATIVE

## 2019-09-28 LAB — GC/CHLAMYDIA PROBE AMP (~~LOC~~) NOT AT ARMC
Chlamydia: NEGATIVE
Comment: NEGATIVE
Comment: NORMAL
Neisseria Gonorrhea: NEGATIVE

## 2019-10-03 DIAGNOSIS — O3680X9 Pregnancy with inconclusive fetal viability, other fetus: Secondary | ICD-10-CM | POA: Diagnosis not present

## 2019-10-03 DIAGNOSIS — O219 Vomiting of pregnancy, unspecified: Secondary | ICD-10-CM | POA: Diagnosis not present

## 2019-10-03 DIAGNOSIS — N925 Other specified irregular menstruation: Secondary | ICD-10-CM | POA: Diagnosis not present

## 2019-10-03 DIAGNOSIS — Z3A09 9 weeks gestation of pregnancy: Secondary | ICD-10-CM | POA: Diagnosis not present

## 2019-10-03 LAB — OB RESULTS CONSOLE RUBELLA ANTIBODY, IGM: Rubella: IMMUNE

## 2019-10-03 LAB — OB RESULTS CONSOLE HIV ANTIBODY (ROUTINE TESTING): HIV: NONREACTIVE

## 2019-10-03 LAB — OB RESULTS CONSOLE ANTIBODY SCREEN: Antibody Screen: NEGATIVE

## 2019-10-03 LAB — OB RESULTS CONSOLE ABO/RH: RH Type: POSITIVE

## 2019-10-03 LAB — OB RESULTS CONSOLE RPR: RPR: NONREACTIVE

## 2019-10-03 LAB — OB RESULTS CONSOLE GC/CHLAMYDIA
Chlamydia: NEGATIVE
Gonorrhea: NEGATIVE

## 2019-10-03 LAB — OB RESULTS CONSOLE HEPATITIS B SURFACE ANTIGEN: Hepatitis B Surface Ag: NEGATIVE

## 2019-10-09 ENCOUNTER — Inpatient Hospital Stay (HOSPITAL_COMMUNITY)
Admission: AD | Admit: 2019-10-09 | Discharge: 2019-10-09 | Disposition: A | Discharge status: Home / Self Care | Payer: 59 | Source: Ambulatory Visit | Attending: Obstetrics and Gynecology | Admitting: Obstetrics and Gynecology

## 2019-10-09 ENCOUNTER — Other Ambulatory Visit: Payer: Self-pay

## 2019-10-09 ENCOUNTER — Inpatient Hospital Stay (HOSPITAL_COMMUNITY): Payer: 59

## 2019-10-09 DIAGNOSIS — Z3A10 10 weeks gestation of pregnancy: Secondary | ICD-10-CM | POA: Diagnosis not present

## 2019-10-09 DIAGNOSIS — O208 Other hemorrhage in early pregnancy: Secondary | ICD-10-CM | POA: Diagnosis not present

## 2019-10-09 DIAGNOSIS — O418X10 Other specified disorders of amniotic fluid and membranes, first trimester, not applicable or unspecified: Secondary | ICD-10-CM

## 2019-10-09 DIAGNOSIS — O468X1 Other antepartum hemorrhage, first trimester: Secondary | ICD-10-CM

## 2019-10-09 DIAGNOSIS — O209 Hemorrhage in early pregnancy, unspecified: Secondary | ICD-10-CM | POA: Diagnosis present

## 2019-10-09 NOTE — MAU Note (Signed)
Pt reports that at 0600 she went to the bathroom and the toilet was full of blood. She had spotting a few weeks ago and was evaluated for it. Was told it was a subchorionic hematoma. Reports she went to her doctor and had an ultrasound and was told it had resolved. Pt denies pain.

## 2019-10-09 NOTE — MAU Provider Note (Signed)
History     CSN: [id number]  Arrival date and time: 10/09/19 8527  Chief Complaint  Patient presents with  . Vaginal Bleeding   Angela Ashley is a 33 y.o. G3P1 at [place] who presents to MAU with complaints of vaginal bleeding. She has a hx of [facility] during this pregnancy that she reports during her last [place] in the office was resolved. This morning woke up to use the restroom and noted bright red blood in the toilet around 0600. She denies recent IC. She denies abdominal pain or cramping. Denies urinary symptoms or other concerns with pregnancy. She receives prenatal [facility] at [facility].    OB History    Gravida  3   Para  2   Term  2   Preterm      AB      Living  1     SAB      TAB      Ectopic      Multiple      Live Births  1           Past Medical History:  Diagnosis Date  . Abnormal Pap smear   . Bacterial vaginosis    reoccuring  . Chlamydia   . Herpes     Past Surgical History:  Procedure Laterality Date  . CESAREAN SECTION N/A 03/26/2013   Procedure: Primary CESAREAN SECTION of baby girl  at  2051  APGAR 9/9;  Surgeon: Melina Schools, MD;  Location: [place] [facility];  Service: Obstetrics;  Laterality: N/A;  . COLPOSCOPY W/ BIOPSY / CURETTAGE     Dr. in [place]    Family History  Problem Relation Age of Onset  . Diabetes Paternal Grandmother   . Healthy Mother   . Healthy Father     Social History   Tobacco Use  . Smoking status: Former Smoker    Types: Cigarettes  . Smokeless tobacco: Never Used  Substance Use Topics  . Alcohol use: Not Currently    Comment: Occassional use before pregnancy  . Drug use: No    Allergies: No Known Allergies  Medications Prior to Admission  Medication Sig Dispense Refill Last Dose  . prenatal vitamin w/FE, FA (PRENATAL 1 + 1) 27-1 MG TABS tablet Take 1 tablet by mouth daily at 12 noon.     Marland Kitchen scopolamine (TRANSDERM-SCOP, 1.5 MG,) 1 MG/3DAYS Place 1 patch (1.5 mg total) onto the skin every 3 (three)  days. 4 patch 0     Review of Systems  Constitutional: Negative.   Respiratory: Negative.   Cardiovascular: Negative.   Gastrointestinal: Negative.   Genitourinary: Positive for vaginal bleeding. Negative for difficulty urinating, dysuria, frequency, pelvic pain and urgency.  Musculoskeletal: Negative.   Neurological: Negative.    Physical Exam   Blood pressure 112/66, pulse 88, temperature 98.4 F (36.9 C), temperature source Oral, resp. rate 15, height 5' (1.524 m), weight 55.4 kg, last menstrual period 07/31/2019, SpO2 100 %.  Physical Exam  Nursing note and vitals reviewed. Constitutional: She is oriented to person, place, and time. She appears well-developed and well-nourished. No distress.  Cardiovascular: Normal rate and regular rhythm.  Respiratory: Breath sounds normal. No respiratory distress. She has no wheezes.  GI: Soft. She exhibits no distension. There is no abdominal tenderness. There is no rebound and no guarding.  Genitourinary:    Genitourinary Comments: Deferred after ultrasound    Musculoskeletal: Normal range of motion.        General: No edema.  Neurological: She is alert and oriented to person, place, and time.  Psychiatric: She has a normal mood and affect. Her behavior is normal. Thought content normal.   FHR 172 by doppler   MAU Course  Procedures  MDM  Care taken over by me at 0812 Prior to care being taken over by me, orders were placed by Luna Kitchens.   Results of [place] reviewed upon care being taken over  US Ob Comp Less 14 Wks  Result Date: 10/09/2019 CLINICAL DATA:  Pregnant patient with vaginal bleeding. EXAM: OBSTETRIC <14 WK ULTRASOUND TECHNIQUE: Transabdominal ultrasound was performed for evaluation of the gestation as well as the maternal uterus and adnexal regions. COMPARISON:  Pelvic ultrasound 09/26/2019 FINDINGS: Intrauterine gestational sac: Single Yolk sac:  Not Visualized. Embryo:  Visualized. Cardiac Activity: Visualized.  Heart Rate: 169 bpm CRL:   39.5 mm   10 w 6 d                  [place] EDC: 04/30/2020 Subchorionic hemorrhage:  Small Maternal uterus/adnexae: Normal right and left ovaries. Left ovarian cyst measuring 2.6 x 2.3 x 2.5 cm. No free fluid in the pelvis. IMPRESSION: Single live intrauterine gestation.  Small subchorionic hemorrhage. Electronically Signed   By: Annia Belt M.D.   On: 10/09/2019 08:04   Physical examination performed and discussed results of [place] with patient. Pelvic examination deferred d/t patient receiving [place] prior. Educated patient on [facility] and what to expect- usual resolve around 14-15 weeks. Encouraged to abstain from intercourse, no extraneous activity or lifting- patient verbalizes understanding.   Discussed reasons to return to MAU. Follow up as scheduled in the office. Return to MAU as needed. Pt stable at time of discharge.   Assessment and Plan   1. Subchorionic hematoma in first trimester, single or unspecified fetus   2. [redacted] weeks gestation of pregnancy    Discharge home Follow up as scheduled in the office for prenatal care Return to MAU as needed for reasons discussed and/or emergencies  [facility] precautions   Follow-up Information    Ob/Gyn, [place] Follow up.   Specialty: Obstetrics and Gynecology Why: Follow up as scheduled on the 23rd for prenatal care and return to MAU as needed Contact information: [address] [place] [address] [phone]          Allergies as of 10/09/2019   No Known Allergies     Medication List    TAKE these medications   prenatal vitamin w/FE, FA 27-1 MG Tabs tablet Take 1 tablet by mouth daily at 12 noon.   scopolamine 1 MG/3DAYS Commonly known as: Transderm-Scop (1.5 MG) Place 1 patch (1.5 mg total) onto the skin every 3 (three) days.      Sharyon Cable CNM 10/09/2019, 9:01 AM

## 2019-10-24 DIAGNOSIS — Z3481 Encounter for supervision of other normal pregnancy, first trimester: Secondary | ICD-10-CM | POA: Diagnosis not present

## 2019-11-02 ENCOUNTER — Other Ambulatory Visit: Payer: Self-pay

## 2019-11-23 DIAGNOSIS — Z369 Encounter for antenatal screening, unspecified: Secondary | ICD-10-CM | POA: Diagnosis not present

## 2019-11-28 DIAGNOSIS — Z03818 Encounter for observation for suspected exposure to other biological agents ruled out: Secondary | ICD-10-CM | POA: Diagnosis not present

## 2019-11-29 ENCOUNTER — Other Ambulatory Visit (HOSPITAL_COMMUNITY): Payer: Self-pay | Admitting: Obstetrics and Gynecology

## 2019-11-29 DIAGNOSIS — D573 Sickle-cell trait: Secondary | ICD-10-CM

## 2019-11-29 DIAGNOSIS — O280 Abnormal hematological finding on antenatal screening of mother: Secondary | ICD-10-CM

## 2019-11-29 DIAGNOSIS — Z363 Encounter for antenatal screening for malformations: Secondary | ICD-10-CM

## 2019-11-29 DIAGNOSIS — Z3A20 20 weeks gestation of pregnancy: Secondary | ICD-10-CM

## 2019-12-07 DIAGNOSIS — M62838 Other muscle spasm: Secondary | ICD-10-CM | POA: Diagnosis not present

## 2019-12-07 DIAGNOSIS — M545 Low back pain: Secondary | ICD-10-CM | POA: Diagnosis not present

## 2019-12-07 DIAGNOSIS — R269 Unspecified abnormalities of gait and mobility: Secondary | ICD-10-CM | POA: Diagnosis not present

## 2019-12-07 DIAGNOSIS — M6281 Muscle weakness (generalized): Secondary | ICD-10-CM | POA: Diagnosis not present

## 2019-12-19 DIAGNOSIS — Z3A20 20 weeks gestation of pregnancy: Secondary | ICD-10-CM | POA: Diagnosis not present

## 2019-12-19 DIAGNOSIS — R102 Pelvic and perineal pain: Secondary | ICD-10-CM | POA: Diagnosis not present

## 2019-12-19 DIAGNOSIS — O26899 Other specified pregnancy related conditions, unspecified trimester: Secondary | ICD-10-CM | POA: Diagnosis not present

## 2019-12-21 ENCOUNTER — Other Ambulatory Visit: Payer: Self-pay

## 2019-12-21 ENCOUNTER — Other Ambulatory Visit (HOSPITAL_COMMUNITY): Payer: Self-pay | Admitting: *Deleted

## 2019-12-21 ENCOUNTER — Ambulatory Visit (HOSPITAL_BASED_OUTPATIENT_CLINIC_OR_DEPARTMENT_OTHER): Payer: 59 | Admitting: Genetic Counselor

## 2019-12-21 ENCOUNTER — Ambulatory Visit (HOSPITAL_COMMUNITY)
Admission: RE | Admit: 2019-12-21 | Discharge: 2019-12-21 | Disposition: A | Discharge status: Home / Self Care | Payer: 59 | Source: Ambulatory Visit | Attending: Obstetrics and Gynecology | Admitting: Obstetrics and Gynecology

## 2019-12-21 ENCOUNTER — Encounter (HOSPITAL_COMMUNITY): Payer: Self-pay

## 2019-12-21 ENCOUNTER — Ambulatory Visit (HOSPITAL_COMMUNITY): Payer: Self-pay | Admitting: Genetic Counselor

## 2019-12-21 ENCOUNTER — Ambulatory Visit (HOSPITAL_COMMUNITY): Payer: 59 | Admitting: *Deleted

## 2019-12-21 DIAGNOSIS — O280 Abnormal hematological finding on antenatal screening of mother: Secondary | ICD-10-CM | POA: Diagnosis not present

## 2019-12-21 DIAGNOSIS — J45909 Unspecified asthma, uncomplicated: Secondary | ICD-10-CM

## 2019-12-21 DIAGNOSIS — Z3A20 20 weeks gestation of pregnancy: Secondary | ICD-10-CM | POA: Insufficient documentation

## 2019-12-21 DIAGNOSIS — Z148 Genetic carrier of other disease: Secondary | ICD-10-CM | POA: Insufficient documentation

## 2019-12-21 DIAGNOSIS — O210 Mild hyperemesis gravidarum: Secondary | ICD-10-CM | POA: Diagnosis not present

## 2019-12-21 DIAGNOSIS — Z363 Encounter for antenatal screening for malformations: Secondary | ICD-10-CM | POA: Insufficient documentation

## 2019-12-21 DIAGNOSIS — O99019 Anemia complicating pregnancy, unspecified trimester: Secondary | ICD-10-CM | POA: Insufficient documentation

## 2019-12-21 DIAGNOSIS — D573 Sickle-cell trait: Secondary | ICD-10-CM

## 2019-12-21 DIAGNOSIS — Z3490 Encounter for supervision of normal pregnancy, unspecified, unspecified trimester: Secondary | ICD-10-CM

## 2019-12-21 DIAGNOSIS — O34219 Maternal care for unspecified type scar from previous cesarean delivery: Secondary | ICD-10-CM

## 2019-12-21 DIAGNOSIS — O99891 Other specified diseases and conditions complicating pregnancy: Secondary | ICD-10-CM

## 2019-12-21 DIAGNOSIS — O359XX0 Maternal care for (suspected) fetal abnormality and damage, unspecified, not applicable or unspecified: Secondary | ICD-10-CM

## 2019-12-21 DIAGNOSIS — O468X1 Other antepartum hemorrhage, first trimester: Secondary | ICD-10-CM

## 2019-12-21 DIAGNOSIS — Z315 Encounter for genetic counseling: Secondary | ICD-10-CM

## 2019-12-21 DIAGNOSIS — O418X10 Other specified disorders of amniotic fluid and membranes, first trimester, not applicable or unspecified: Secondary | ICD-10-CM | POA: Insufficient documentation

## 2019-12-21 NOTE — Progress Notes (Signed)
12/21/2019  MAGDALA BRAHMBHATT 1986/07/24 MRN: [id number] DOV: 12/21/2019  Ms. 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 Ms. Weir came to her appointment alone due to COVID-19 visitor restrictions.   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 Peragine is a [id number], 34 y.o. female. Her current pregnancy has completed [redacted]w[redacted]d(Estimated Date of Delivery: 05/06/20).  Ms. LDelgrandedenied exposure to environmental toxins or chemical agents. She denied the use of alcohol, tobacco or street drugs. She reported taking prenatal vitamins. She denied significant viral illnesses or fevers during the course of her pregnancy. She had bleeding from a hematoma earlier in the pregnancy. Her medical and surgical histories were noncontributory.  Family History  A three generation pedigree was drafted and reviewed. The family history is remarkable for the following:  - Ms. Guizar's 631year old daughter was reportedly born with a "hole in her heart" that did not require surgery. Her medical history is otherwise noncontributory. There are multifactorial causes for isolated congenital heart defects (CHDs), including environmental and genetic factors. As such, the recurrence risk for first degree relatives is elevated over the general population incidence of 1/200 (0.5%). We discussed that without knowing the exact etiology, the risk for a sibling of an affected child to also have a CHD is approximately 4%.   - Ms. Scheurich's daughter described above also has a history of speech delays. She receives speech therapy and has an IEP in school. We discussed that many times, developmental delays such as speech delay are multifactorial in nature, occurring due to a combination of genetic and environmental factors that are difficult to identify. Developmental delays can appear to run in  families; thus, there is a chance that Ms. Ledger's other children could also experience developmental delays of some kind. She understands that she should make the pediatrician aware of any concerns she has about her children's development.  The remaining family histories were reviewed and found to be noncontributory for birth defects, intellectual disability, recurrent pregnancy loss, and known genetic conditions.    The patient's ethnicity is African American. The father of the pregnancy's ethnicity is African American. Ashkenazi Jewish ancestry and consanguinity were denied. Pedigree will be scanned under Media.  Discussion  Ms. 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   We discussed that SCA is one condition in a group of blood disorders that affect hemoglobin in red blood cells (hemoglobinopathies). Hemoglobin is a protein that transports oxygen from the lungs to organs and tissues throughout the body. Individuals with SCA have an inherited structural abnormality in hemoglobin's beta globin chains due to a single amino acid change in the HBB gene. Instead of producing normal adult hemoglobin (Hgb A), individuals with SCA produce an atypical form of hemoglobin called hemoglobin S (Hgb S). Typically, individuals are expected to have two copies of Hgb A (Hgb AA). Individuals who are carriers of SCA have one copy of Hgb A and one copy of Hgb S (Hgb AS), whereas individuals affected by SCA have two copies of Hgb S (Hgb SS). Carriers of SCA are often said to have sickle cell "trait".  Hgb S alters the configuration of the hemoglobin molecule. As a result, individuals with SCA have red blood  cells that can sickle and obstruct blood flow in small blood vessels, causing ischemia of tissues and organs and episodes of vaso-occlusive crisis. The  amino acid change in the HBB gene also causes red blood cells to become fragile and break down easily, which results in chronic anemia. Additional complications associated with SCA may include organ damage, frequent infections, acute chest syndrome, ischemic stroke, splenic sequestration, priapism, and pulmonary hypertension. SCA is inherited in an autosomal recessive pattern, where both parents must carry Hgb S trait to be at risk of having an affected child. If Ms. Ridings's partner were also a carrier of SCA, they would have a 1 in 4 (25%) chance of having a child with SCA.   Hgb S is just one variant form of hemoglobin caused by a mutations in the HBB gene. It is also possible that Ms. Sadek's partner could carry another variant form of hemoglobin, such as hemoglobin C. If he did, the couple would have a 1 in 4 (25%) chance of having a child with hemoglobin [place] disease (HbSC disease). Individuals with HbSC disease have red blood cells that contain both hemoglobin S and hemoglobin C. These variant forms of hemoglobin can cause red blood cells to become rigid and sickle, blocking small blood vessels and making it difficult for the red blood cells to deliver oxygen to the body's tissues. This can cause severe pain and organ damage, just as we see in individuals with SCA. Individuals with HbSC disease are at risk of the same complications as those associated with SCA, such as pain crises, acute chest syndrome, infections, asplenia, and strokes; however, these complications may occur at a lesser frequency and may be milder.  Finally, Ms. Yakubov's partner may have a different variant in the HBB gene that could make a carrier of beta-thalassemia. If he did, the couple would have a 1 in 4 (25%) chance of having a child with sickle beta thalassemia. The severity of sickle beta thalassemia depends on the normal amount of beta globin that is produced. If an individual produces no beta globin (sickle beta zero  thalassemia), they will experience symptoms similar to SCA. If an individual produces a reduced amount of beta globin (sickle beta plus thalassemia), they may experience symptoms that are similar to a milder form of SCA.  Given his ethnicity, Ms. Verno's partner has a 1 in 11 chance of carrying hemoglobin S trait, a 1 in 38 chance at carrying hemoglobin C trait, and a 1 in 75 chance of carrying beta-thalassemia. It is recommended that he undergo carrier screening to refine the risks for the current pregnancy to be affected with SCA, HbC disease, or sickle beta thalassemia. Ms. Andringa was interested in pursuing partner carrier screening.  Per ACOG recommendation, carrier screening for cystic fibrosis (CF) and spinal muscular atrophy (SMA) was also discussed including information about the conditions, rationale for testing, autosomal recessive inheritance, and the option of prenatal diagnosis. Ms. Stech was unsure of whether or not carrier screening for CF and SMA had been ordered by her OBGYN provider. Without carrier screening to refine risk and based on ethnicity alone, Ms. Adeyemi risk to be a carrier of CF is 1 in 41 and her risk to be a carrier of SMA is 1 in 61. She was informed that select hemoglobinopathies and CF are included on [place]'s newborn screen, but that SMA currently is not included. We discussed the Early Check research study to add SMA to her baby's newborn screening panel. Ms. Doolen  indicated that she was interested in pursuing this, so she was given written information on how to enroll in the Early Check study. I also encouraged her to reach out to me if she is interested in CF and SMA carrier screening if she determines that it has not yet been completed.  We also reviewed that Ms. Mazzeo had Panorama NIPS through the laboratory [facility] that was low-risk for fetal aneuploidies. We reviewed that these results showed a less than 1 in 10,000 risk for trisomies 21, 18 and 13, and  monosomy X (Turner syndrome).  In addition, the risk for triploidy and sex chromosome trisomies (47,XXX and 47,XXY) was also low. Ms. Kakos elected to have cfDNA analysis for 22q11.2 deletion syndrome, which was also low risk (1 in 9000). We reviewed that while this testing identifies 94-99% of pregnancies with trisomy 27, trisomy 97, trisomy 107, sex chromosome aneuploidies, and triploidy, it is NOT diagnostic. A positive test result requires confirmation by CVS or amniocentesis, and a negative test result does not rule out a fetal chromosome abnormality. She also understands that this testing does not identify all genetic conditions.  A complete ultrasound was performed today prior to our visit. The ultrasound report will be sent under separate cover. There were no visualized fetal anomalies or markers suggestive of aneuploidy.  Ms. Jiggetts was also counseled regarding diagnostic testing via amniocentesis. We discussed the technical aspects of the procedure and quoted up to a 1 in 500 (0.2%) risk for spontaneous pregnancy loss or other adverse pregnancy outcomes as a result of amniocentesis. Cultured cells from an amniocentesis sample allow for the visualization of a fetal karyotype, which can detect >99% of chromosomal aberrations. Chromosomal microarray can also be performed to identify smaller deletions or duplications of fetal chromosomal material. Amniocentesis could also be performed to assess whether the baby is affected by SCA. After careful consideration, Ms. Iannelli declined amniocentesis at this time. She understands that amniocentesis is available at any point after 16 weeks of pregnancy and that she may opt to undergo the procedure at a later date should she change her mind.  Finally, Ms. Laidler had negative screening for open neural tube defects (ONTDs) via MS-AFP screening. These results decreased the risk of an ONTD such as spina bifida in the fetus to 1 in 2681. Level II ultrasound and MS-AFP  are able to detect ONTDs with 90-95% sensitivity. However, normal results from any of the above options do not guarantee a normal baby, as 3-5% of newborns have some type of birth defect, many of which are not prenatally diagnosable.  Ms. Pinkney was interested in pursuing carrier screening for HBB-related hemoglobinopathies for her partner, Kamela Blansett. However, Mr. Ruland not currently have health insurance coverage. We discussed that I could potentially facilitate free testing for him through the laboratory [facility]'s Patient Assistance Program.Ms. Pelzel and I made a plan for her to contact me once shehas had an opportunity to discuss carrier screening withher partner.Icansend him TEFL teacher I hear from her. Results will take 2-3 weeks to be delivered from the time the laboratory receives the sample. I will call Ms.Londonwhen results become available  I counseled Ms. Biss regarding the above risks and available options. The approximate face-to-face time with the genetic counselor was 25 minutes.  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 Was provided with information on Early Check study to  add SMA to newborn screen  Desires partner carrier screening. Partner qualifies for free testing through [facility]'s Patient Assistance Program. Patient will contact me to facilitate partner testing once she has discussed it with him  Reviewed low-risk NIPS results  Reduction in risk for Down syndrome,trisomy 18,trisomy 13,triploidy,sex chromosome aneuploidies, and 22q11.2deletion syndrome  Reviewed results of ultrasound  No fetal anomalies or markers seen  Reduction in risk for fetal aneuploidy  Offered additional testing and screening  Declined amniocentesis  Reviewed family history concerns   Buelah Manis, [place]  Counselor

## 2020-01-26 ENCOUNTER — Ambulatory Visit (HOSPITAL_COMMUNITY): Payer: 59

## 2020-01-27 ENCOUNTER — Ambulatory Visit (HOSPITAL_COMMUNITY): Payer: 59 | Admitting: *Deleted

## 2020-01-27 ENCOUNTER — Encounter (HOSPITAL_COMMUNITY): Payer: Self-pay

## 2020-01-27 ENCOUNTER — Ambulatory Visit (HOSPITAL_COMMUNITY)
Admission: RE | Admit: 2020-01-27 | Discharge: 2020-01-27 | Disposition: A | Discharge status: Home / Self Care | Payer: 59 | Source: Ambulatory Visit | Attending: Obstetrics | Admitting: Obstetrics

## 2020-01-27 ENCOUNTER — Other Ambulatory Visit: Payer: Self-pay

## 2020-01-27 VITALS — BP 101/63 | HR 97

## 2020-01-27 DIAGNOSIS — O359XX0 Maternal care for (suspected) fetal abnormality and damage, unspecified, not applicable or unspecified: Secondary | ICD-10-CM | POA: Diagnosis not present

## 2020-01-27 DIAGNOSIS — Z3490 Encounter for supervision of normal pregnancy, unspecified, unspecified trimester: Secondary | ICD-10-CM | POA: Diagnosis not present

## 2020-01-27 DIAGNOSIS — O0990 Supervision of high risk pregnancy, unspecified, unspecified trimester: Secondary | ICD-10-CM

## 2020-01-27 DIAGNOSIS — Z362 Encounter for other antenatal screening follow-up: Secondary | ICD-10-CM | POA: Diagnosis not present

## 2020-01-27 DIAGNOSIS — O210 Mild hyperemesis gravidarum: Secondary | ICD-10-CM | POA: Insufficient documentation

## 2020-01-27 DIAGNOSIS — Z3A25 25 weeks gestation of pregnancy: Secondary | ICD-10-CM | POA: Diagnosis not present

## 2020-01-27 DIAGNOSIS — O34219 Maternal care for unspecified type scar from previous cesarean delivery: Secondary | ICD-10-CM

## 2020-01-27 DIAGNOSIS — O99891 Other specified diseases and conditions complicating pregnancy: Secondary | ICD-10-CM

## 2020-01-27 DIAGNOSIS — J45909 Unspecified asthma, uncomplicated: Secondary | ICD-10-CM

## 2020-01-31 DIAGNOSIS — Z362 Encounter for other antenatal screening follow-up: Secondary | ICD-10-CM | POA: Diagnosis not present

## 2020-02-10 ENCOUNTER — Other Ambulatory Visit: Payer: Self-pay | Admitting: Obstetrics & Gynecology

## 2020-02-21 DIAGNOSIS — Z23 Encounter for immunization: Secondary | ICD-10-CM | POA: Diagnosis not present

## 2020-02-21 DIAGNOSIS — Z3A29 29 weeks gestation of pregnancy: Secondary | ICD-10-CM | POA: Diagnosis not present

## 2020-02-21 DIAGNOSIS — Z3483 Encounter for supervision of other normal pregnancy, third trimester: Secondary | ICD-10-CM | POA: Diagnosis not present

## 2020-03-01 ENCOUNTER — Encounter (HOSPITAL_COMMUNITY): Payer: Self-pay | Admitting: Obstetrics & Gynecology

## 2020-03-01 ENCOUNTER — Inpatient Hospital Stay (HOSPITAL_COMMUNITY)
Admission: AD | Admit: 2020-03-01 | Discharge: 2020-03-01 | Disposition: A | Discharge status: Home / Self Care | Payer: 59 | Attending: Obstetrics & Gynecology | Admitting: Obstetrics & Gynecology

## 2020-03-01 ENCOUNTER — Other Ambulatory Visit: Payer: Self-pay

## 2020-03-01 DIAGNOSIS — O4703 False labor before 37 completed weeks of gestation, third trimester: Secondary | ICD-10-CM | POA: Insufficient documentation

## 2020-03-01 DIAGNOSIS — Z3A30 30 weeks gestation of pregnancy: Secondary | ICD-10-CM | POA: Insufficient documentation

## 2020-03-01 DIAGNOSIS — O479 False labor, unspecified: Secondary | ICD-10-CM

## 2020-03-01 DIAGNOSIS — Z87891 Personal history of nicotine dependence: Secondary | ICD-10-CM | POA: Diagnosis not present

## 2020-03-01 LAB — URINALYSIS, ROUTINE W REFLEX MICROSCOPIC
Bilirubin Urine: NEGATIVE
Glucose, UA: NEGATIVE mg/dL
Hgb urine dipstick: NEGATIVE
Ketones, ur: NEGATIVE mg/dL
Leukocytes,Ua: NEGATIVE
Nitrite: NEGATIVE
Protein, ur: NEGATIVE mg/dL
Specific Gravity, Urine: 1.012 (ref 1.005–1.030)
pH: 6 (ref 5.0–8.0)

## 2020-03-01 NOTE — MAU Note (Signed)
Started having Braxton Hicks yesterday afternoon.  Has continued, not closer or stronger. No bleeding or leaking. Has preg massage yesterday. No hx of PTL

## 2020-03-01 NOTE — Discharge Instructions (Signed)
Fetal Movement Counts Patient Name: ________________________________________________ Patient Due Date: ____________________ What is a fetal movement count?  A fetal movement count is the number of times that you feel your baby move during a certain amount of time. This may also be called a fetal kick count. A fetal movement count is recommended for every pregnant woman. You may be asked to start counting fetal movements as early as week 28 of your pregnancy. Pay attention to when your baby is most active. You may notice your baby's sleep and wake cycles. You may also notice things that make your baby move more. You should do a fetal movement count:  When your baby is normally most active.  At the same time each day. A good time to count movements is while you are resting, after having something to eat and drink. How do I count fetal movements? 1. Find a quiet, comfortable area. Sit, or lie down on your side. 2. Write down the date, the start time and stop time, and the number of movements that you felt between those two times. Take this information with you to your health care visits. 3. Write down your start time when you feel the first movement. 4. Count kicks, flutters, swishes, rolls, and jabs. You should feel at least 10 movements. 5. You may stop counting after you have felt 10 movements, or if you have been counting for 2 hours. Write down the stop time. 6. If you do not feel 10 movements in 2 hours, contact your health care provider for further instructions. Your health care provider may want to do additional tests to assess your baby's well-being. Contact a health care provider if:  You feel fewer than 10 movements in 2 hours.  Your baby is not moving like he or she usually does. Date: ____________ Start time: ____________ Stop time: ____________ Movements: ____________ Date: ____________ Start time: ____________ Stop time: ____________ Movements: ____________ Date: ____________  Start time: ____________ Stop time: ____________ Movements: ____________ Date: ____________ Start time: ____________ Stop time: ____________ Movements: ____________ Date: ____________ Start time: ____________ Stop time: ____________ Movements: ____________ Date: ____________ Start time: ____________ Stop time: ____________ Movements: ____________ Date: ____________ Start time: ____________ Stop time: ____________ Movements: ____________ Date: ____________ Start time: ____________ Stop time: ____________ Movements: ____________ Date: ____________ Start time: ____________ Stop time: ____________ Movements: ____________ This information is not intended to replace advice given to you by your health care provider. Make sure you discuss any questions you have with your health care provider. Document Revised: 07/07/2019 Document Reviewed: 07/07/2019 Elsevier Patient Education  2020 Else[employer] Inc. Braxton Hicks Contractions Contractions of the uterus can occur throughout pregnancy, but they are not always a sign that you are in labor. You may have practice contractions called Braxton Hicks contractions. These false labor contractions are sometimes confused with true labor. What are Braxton Hicks contractions? Braxton Hicks contractions are tightening movements that occur in the muscles of the uterus before labor. Unlike true labor contractions, these contractions do not result in opening (dilation) and thinning of the cervix. Toward the end of pregnancy (32-34 weeks), Braxton Hicks contractions can happen more often and may become stronger. These contractions are sometimes difficult to tell apart from true labor because they can be very uncomfortable. You should not feel embarrassed if you go to the hospital with false labor. Sometimes, the only way to tell if you are in true labor is for your health care provider to look for changes in the cervix. The health care provider   will do a physical exam and may  monitor your contractions. If you are not in true labor, the exam should show that your cervix is not dilating and your water has not broken. If there are no other health problems associated with your pregnancy, it is completely safe for you to be sent home with false labor. You may continue to have Braxton Hicks contractions until you go into true labor. How to tell the difference between true labor and false labor True labor  Contractions last 30-70 seconds.  Contractions become very regular.  Discomfort is usually felt in the top of the uterus, and it spreads to the lower abdomen and low back.  Contractions do not go away with walking.  Contractions usually become more intense and increase in frequency.  The cervix dilates and gets thinner. False labor  Contractions are usually shorter and not as strong as true labor contractions.  Contractions are usually irregular.  Contractions are often felt in the front of the lower abdomen and in the groin.  Contractions may go away when you walk around or change positions while lying down.  Contractions get weaker and are shorter-lasting as time goes on.  The cervix usually does not dilate or become thin. Follow these instructions at home:   Take over-the-counter and prescription medicines only as told by your health care provider.  Keep up with your usual exercises and follow other instructions from your health care provider.  Eat and drink lightly if you think you are going into labor.  If Braxton Hicks contractions are making you uncomfortable: ? Change your position from lying down or resting to walking, or change from walking to resting. ? Sit and rest in a tub of warm water. ? Drink enough fluid to keep your urine pale yellow. Dehydration may cause these contractions. ? Do slow and deep breathing several times an hour.  Keep all follow-up prenatal visits as told by your health care provider. This is important. Contact a  health care provider if:  You have a fever.  You have continuous pain in your abdomen. Get help right away if:  Your contractions become stronger, more regular, and closer together.  You have fluid leaking or gushing from your vagina.  You pass blood-tinged mucus (bloody show).  You have bleeding from your vagina.  You have low back pain that you never had before.  You feel your baby's head pushing down and causing pelvic pressure.  Your baby is not moving inside you as much as it used to. Summary  Contractions that occur before labor are called Braxton Hicks contractions, false labor, or practice contractions.  Braxton Hicks contractions are usually shorter, weaker, farther apart, and less regular than true labor contractions. True labor contractions usually become progressively stronger and regular, and they become more frequent.  Manage discomfort from Braxton Hicks contractions by changing position, resting in a warm bath, drinking plenty of water, or practicing deep breathing. This information is not intended to replace advice given to you by your health care provider. Make sure you discuss any questions you have with your health care provider. Document Revised: 10/30/2017 Document Reviewed: 04/02/2017 Elsevier Patient Education  2020 Elsevier Inc.  

## 2020-03-01 NOTE — MAU Provider Note (Signed)
Chief Complaint:  Contractions   None    HPI: Angela Ashley is a 34 y.o. G3P2002 at [redacted]w[redacted]d who presents to maternity admissions reporting contractions. Has had some irregular contractions since last night. Describes them as tightening & baby balling up. Spaced out this morning and increased today while at work. Denies n/v/d, LOF, vaginal bleeding, or dysuria. Good fetal movement.  Denies pain at this time.  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   Pregnancy Course: [facility]  Past Medical History:  Diagnosis Date  . Abnormal Pap smear   . Asthma   . Bacterial vaginosis    reoccuring  . Chlamydia   . Herpes   . Sickle cell trait (HCC)    OB History  Gravida Para Term Preterm AB Living  3 2 2     2   SAB TAB Ectopic Multiple Live Births          1    # Outcome Date GA Lbr Len/2nd Weight Sex Delivery Anes PTL Lv  3 Current           2 Term 03/26/13 [redacted]w[redacted]d  3025 g F CS-LTranv Spinal    1 Term 11/29/10 [redacted]w[redacted]d   F Vag-Spont EPI  LIV   Past Surgical History:  Procedure Laterality Date  . CESAREAN SECTION N/A 03/26/2013   Procedure: Primary CESAREAN SECTION of baby girl  at  2051  APGAR 9/9;  Surgeon: 11/9, MD;  Location: [facility];  Service: Obstetrics;  Laterality: N/A;  . COLPOSCOPY W/ BIOPSY / CURETTAGE     Dr. in [place]   Family History  Problem Relation Age of Onset  . Diabetes Paternal Grandmother   . Healthy Mother   . Healthy Father    Social History   Tobacco Use  . Smoking status: Former Smoker    Types: Cigarettes  . Smokeless tobacco: Never Used  Substance Use Topics  . Alcohol use: Not Currently    Comment: Occassional use before pregnancy  . Drug use: No   No Known Allergies No medications prior to admission.    I have reviewed patient's Past Medical Hx, Surgical Hx, Family Hx, Social Hx, medications and allergies.   ROS:  Review of Systems  Constitutional: Negative.   Gastrointestinal: Negative.    Genitourinary: Negative.     Physical Exam   Patient Vitals for the past 24 hrs:  BP Temp Temp src Pulse Resp SpO2  03/01/20 1755 110/73 -- -- -- -- --  03/01/20 1641 103/66 98.3 F (36.8 C) Oral 100 18 100 %    Constitutional: Well-developed, well-nourished female in no acute distress.  Cardiovascular: normal rate & rhythm, no murmur Respiratory: normal effort, lung sounds clear throughout GI: Abd soft, non-tender, gravid appropriate for gestational age. Pos BS x 4 MS: Extremities nontender, no edema, normal ROM Neurologic: Alert and oriented x 4.  GU:     Dilation: Closed Effacement (%): Thick Cervical Position: Posterior Exam by:: [email or web address] NP  NST:  Baseline: 140 bpm, Variability: Good {> 6 bpm), Accelerations: Non-reactive but appropriate for gestational age and Decelerations: Absent   Labs: Results for orders placed or performed during the hospital encounter of 03/01/20 (from the past 24 hour(s))  Urinalysis, Routine w reflex microscopic     Status: None   Collection Time: 03/01/20  5:04 PM  Result Value Ref Range   Color, Urine YELLOW YELLOW   APPearance CLEAR CLEAR   Specific Gravity,  Urine 1.012 1.005 - 1.030   pH 6.0 5.0 - 8.0   Glucose, UA NEGATIVE NEGATIVE mg/dL   Hgb urine dipstick NEGATIVE NEGATIVE   Bilirubin Urine NEGATIVE NEGATIVE   Ketones, ur NEGATIVE NEGATIVE mg/dL   Protein, ur NEGATIVE NEGATIVE mg/dL   Nitrite NEGATIVE NEGATIVE   Leukocytes,Ua NEGATIVE NEGATIVE    Imaging:  No results found.  MAU Course: Orders Placed This Encounter  Procedures  . Urinalysis, Routine w reflex microscopic  . Discharge patient   No orders of the defined types were placed in this encounter.   MDM: Pt denies pain Cervix closed/thick No regular ctx on monitor U/a negative  Pt came down to MAU during her work shift and would like to be discharged to return to work. Reviewed s/s of preterm labor & reasons to return to MAU. Pt should f/u with her  ob/gyn  Assessment: 1. Braxton Hick's contraction   2. [redacted] weeks gestation of pregnancy     Plan: Discharge home in stable condition.  PTL precautions Increase water intake   Follow-up Information    [facility] Follow up.   Specialty: Obstetrics and Gynecology Contact information: [address] [address] [phone]          Allergies as of 03/01/2020   No Known Allergies     Medication List    STOP taking these medications   scopolamine 1 MG/3DAYS Commonly known as: Transderm-Scop (1.5 MG)     TAKE these medications   prenatal vitamin w/FE, FA 27-1 MG Tabs tablet Take 1 tablet by mouth daily at 12 noon.       Jorje Guild, NP 03/01/2020 6:59 PM

## 2020-04-12 DIAGNOSIS — A609 Anogenital herpesviral infection, unspecified: Secondary | ICD-10-CM | POA: Diagnosis not present

## 2020-04-12 DIAGNOSIS — Z3A36 36 weeks gestation of pregnancy: Secondary | ICD-10-CM | POA: Diagnosis not present

## 2020-04-12 DIAGNOSIS — N898 Other specified noninflammatory disorders of vagina: Secondary | ICD-10-CM | POA: Diagnosis not present

## 2020-04-12 DIAGNOSIS — O99019 Anemia complicating pregnancy, unspecified trimester: Secondary | ICD-10-CM | POA: Diagnosis not present

## 2020-04-12 DIAGNOSIS — Z3483 Encounter for supervision of other normal pregnancy, third trimester: Secondary | ICD-10-CM | POA: Diagnosis not present

## 2020-04-12 DIAGNOSIS — O4290 Premature rupture of membranes, unspecified as to length of time between rupture and onset of labor, unspecified weeks of gestation: Secondary | ICD-10-CM | POA: Diagnosis not present

## 2020-04-16 ENCOUNTER — Encounter (HOSPITAL_COMMUNITY): Payer: Self-pay

## 2020-04-16 NOTE — Patient Instructions (Signed)
Ob  JHORDYN HOOPINGARNER  04/16/2020   Your procedure is scheduled on:  04/29/2020  Arrive at 0930 at Entrance C on [address] at [facility]  and [employer]. You are invited to use the FREE valet parking or use the Visitor's parking deck.  Pick up the phone at the desk and dial [phone].  Call this number if you have problems the morning of surgery: [phone]  Remember:   Do not eat food:(After Midnight) Desps de medianoche.  Do not drink clear liquids: (After Midnight) Desps de medianoche.  Take these medicines the morning of surgery with A SIP OF WATER:  none   Do not wear jewelry, make-up or nail polish.  Do not wear lotions, powders, or perfumes. Do not wear deodorant.  Do not shave 48 hours prior to surgery.  Do not bring valuables to the hospital.  [facility] is not   responsible for any belongings or valuables brought to the hospital.  Contacts, dentures or bridgework may not be worn into surgery.  Leave suitcase in the car. After surgery it may be brought to your room.  For patients admitted to the hospital, checkout time is 11:00 AM the day of              discharge.      Please read over the following fact sheets that you were given:     Preparing for Surgery

## 2020-04-19 DIAGNOSIS — Z3A37 37 weeks gestation of pregnancy: Secondary | ICD-10-CM | POA: Diagnosis not present

## 2020-04-19 DIAGNOSIS — Z3483 Encounter for supervision of other normal pregnancy, third trimester: Secondary | ICD-10-CM | POA: Diagnosis not present

## 2020-04-19 DIAGNOSIS — O368199 Decreased fetal movements, unspecified trimester, other fetus: Secondary | ICD-10-CM | POA: Diagnosis not present

## 2020-04-19 DIAGNOSIS — O99019 Anemia complicating pregnancy, unspecified trimester: Secondary | ICD-10-CM | POA: Diagnosis not present

## 2020-04-23 ENCOUNTER — Telehealth: Payer: Self-pay | Admitting: Hematology

## 2020-04-23 NOTE — Telephone Encounter (Signed)
Received an urgent new hem referral from Dr. Sallye Ober at [facility] for Anemia complicating pregnancy. Ms. Gau has been scheduled to see Dr. Candise Che on 5/27 at 2:40pm. I lft the appt date and time on the pt's voicemail. I cld and left Bonita Quin from the referring office, the appt date and time.

## 2020-04-23 NOTE — Telephone Encounter (Signed)
Pt cld back to confirm her appt w/Dr. Candise Che on 5/27 at 2:40pm. Pt aware to arrive 15 minutes early.

## 2020-04-25 NOTE — Progress Notes (Signed)
HEMATOLOGY/ONCOLOGY CONSULTATION NOTE  Date of Service: 04/26/2020  Patient Care Team: Angela Koyanagi, DO as PCP - General (Family Medicine)  REFERRING PHYSICIAN: Terressa Koyanagi, DO  CHIEF COMPLAINTS/PURPOSE OF CONSULTATION:  Anemia Complicating Pregnancy   HISTORY OF PRESENTING ILLNESS:   Angela Ashley is a wonderful 34 y.o. female who has been referred to [place] by Angela Koyanagi, DO, and Angela Ashley, MDfor evaluation and management of Anemia Complicating Pregnancy. The pt reports that she is doing well overall.   The pt reports she is good. Pt is currently [redacted] weeks pregnant and getting Caesarean section on Sunday. She has been anemic throughout her pregnancy. Pt has been very sick during this pregnancy and she has been afraid to take pills. She was not on any oral iron pills but it was recommended a few weeks ago. Between pregnancies her periods have been on a heavier side. Pt has to get labs tomorrow prior to going into surgery on Sunday.   Most recent lab results (04/16/20) of CBC is as follows: all values are WNL except for Hemoglobin at 9.2, Hematocrit at 29.6, MCV at 73, MCH at 22.5, MCHC at 33.1, RDW at 20.9  On review of systems, pt reports dizziness, fatigue, healthy appetite, staying hydrate and denies bleeding issues and any other symptoms.   On PMHx the pt reports this is pt's 3rd pregnancy, 1st vaginal delivery, 2nd caesarian section in 2014, 3rd caesarian section on 04/29/20, asthma, teeth removal, sickle cell trait   MEDICAL HISTORY:  Past Medical History:  Diagnosis Date  . Abnormal Pap smear   . Asthma   . Bacterial vaginosis    reoccuring  . Chlamydia   . Herpes   . Sickle cell trait (HCC)      SURGICAL HISTORY: Past Surgical History:  Procedure Laterality Date  . CESAREAN SECTION N/A 03/26/2013   Procedure: Primary CESAREAN SECTION of baby girl  at  2051  APGAR 9/9;  Surgeon: Bing Plume, MD;  Location: [facility];  Service: Obstetrics;  Laterality: N/A;   . COLPOSCOPY W/ BIOPSY / CURETTAGE     Dr. in [place]     SOCIAL HISTORY: Social History   Socioeconomic History  . Marital status: Married    Spouse name: Not on file  . Number of children: Not on file  . Years of education: Not on file  . Highest education level: Not on file  Occupational History  . Not on file  Tobacco Use  . Smoking status: Former Smoker    Types: Cigarettes  . Smokeless tobacco: Never Used  Substance and Sexual Activity  . Alcohol use: Not Currently    Comment: Occassional use before pregnancy  . Drug use: No  . Sexual activity: Yes    Birth control/protection: None  Other Topics Concern  . Not on file  Social History Narrative   Work or School: Ship broker      Home Situation: lives with children 4 and 6 in 03/2017      Spiritual Beliefs: Christian      Lifestyle: diet is ok other then soda, no regular aerobic exercise      Drinks wine 1 glass q 3 weeks.   Social Determinants of Health   Financial Resource Strain:   . Difficulty of Paying Living Expenses:   Food Insecurity:   . Worried About Programme researcher, broadcasting/film/video in the Last Year:   . [employer] of Food in the Last Year:  Transportation Needs:   . Film/video editor (Medical):   Marland Kitchen Lack of Transportation (Non-Medical):   Physical Activity:   . Days of Exercise per Week:   . Minutes of Exercise per Session:   Stress:   . Feeling of Stress :   Social Connections:   . Frequency of Communication with Friends and Family:   . Frequency of Social Gatherings with Friends and Family:   . Attends Religious Services:   . Active Member of Clubs or Organizations:   . Attends Archivist Meetings:   Marland Kitchen Marital Status:   Intimate Partner Violence:   . Fear of Current or Ex-Partner:   . Emotionally Abused:   Marland Kitchen Physically Abused:   . Sexually Abused:      FAMILY HISTORY: Family History  Problem Relation Age of Onset  . Diabetes Paternal Grandmother   . Healthy Mother    . Healthy Father   . Hypertension Maternal Grandmother      ALLERGIES:   has No Known Allergies.   MEDICATIONS:  Current Outpatient Medications  Medication Sig Dispense Refill  . prenatal vitamin w/FE, FA (PRENATAL 1 + 1) 27-1 MG TABS tablet Take 1 tablet by mouth at bedtime.      No current facility-administered medications for this visit.     REVIEW OF SYSTEMS:   A 10+ POINT REVIEW OF SYSTEMS WAS OBTAINED including neurology, dermatology, psychiatry, cardiac, respiratory, lymph, extremities, GI, GU, Musculoskeletal, constitutional, breasts, reproductive, HEENT.  All pertinent positives are noted in the HPI.  All others are negative.   PHYSICAL EXAMINATION: ECOG PERFORMANCE STATUS: 1 - Symptomatic but completely ambulatory  Vitals:   04/26/20 1505  BP: 107/77  Pulse: (!) 106  Resp: 18  Temp: 97.9 F (36.6 C)  SpO2: 100%   Filed Weights   04/26/20 1505  Weight: 154 lb 9.6 oz (70.1 kg)   Body mass index is 26.54 kg/m.  GENERAL:alert, in no acute distress and comfortable SKIN: no acute rashes, no significant lesions EYES: conjunctiva are pink and non-injected, sclera anicteric OROPHARYNX: MMM, no exudates, no oropharyngeal erythema or ulceration NECK: supple, no JVD LYMPH:  no palpable lymphadenopathy in the cervical, axillary or inguinal regions LUNGS: clear to auscultation b/l with normal respiratory effort HEART: regular rate & rhythm ABDOMEN:  normoactive bowel sounds , non tender, not distended. Extremity: no pedal edema PSYCH: alert & oriented x 3 with fluent speech NEURO: no focal motor/sensory deficits  LABORATORY DATA:  I have reviewed the data as listed  CBC Latest Ref Rng & Units 09/26/2019 02/11/2017 09/19/2013  WBC 4.0 - 10.5 K/uL 11.3(H) 7.7 7.8  Hemoglobin 12.0 - 15.0 g/dL 13.3 14.2 12.6  Hematocrit 36.0 - 46.0 % 38.3 42.9 37.8  Platelets 150 - 400 K/uL 237 303 280.0    CMP Latest Ref Rng & Units 02/11/2017 11/30/2010 11/29/2010  Glucose 65  - 99 mg/dL 69 122(H) 87  BUN 6 - 20 mg/dL 8 3(L) 3(L)  Creatinine 0.57 - 1.00 mg/dL 0.79 0.72 0.57  Sodium 134 - 144 mmol/L 140 137 136  Potassium 3.5 - 5.2 mmol/L 4.4 3.5 3.5  Chloride 96 - 106 mmol/L 100 107 108  CO2 18 - 29 mmol/L 21 19 22   Calcium 8.7 - 10.2 mg/dL 9.9 8.8 8.7  Total Protein 6.0 - 8.5 g/dL 7.9 5.9(L) 6.2  Total Bilirubin 0.0 - 1.2 mg/dL 0.6 1.1 0.8  Alkaline Phos 39 - 117 IU/L 65 246(H) 273(H)  AST 0 - 40 IU/L 17 42(H) 24  ALT 0 - 32 IU/L 10 20 18       RADIOGRAPHIC STUDIES: I have personally reviewed the radiological images as listed and agreed with the findings in the report. No results found.   ASSESSMENT & PLAN:  GALILEA QUITO is a 34 y.o. female with:  1. Anemia in pregnancy at term 2/ Presumed Iron deficiency anemia  PLAN: -Discussed patient's most recent labs from 04/16/20, of CBC is as follows: all values are WNL except for Hemoglobin at 9.2, Hematocrit at 29.6, MCV at 73, MCH at 22.5, MCHC at 33.1, RDW at 20.9 -Educated on oral iron  -Advised on risk of transfusions -mild fever, mild reaction, mild jaundice, fluid overload, blood count abnormalities  -Advised blood transfusion is not emergent but since having surgery/C-section the transfusion would be to optimize hgb closer to 10 -Advised iron levels at term and surgery  -Advised cannot give IV iron today and since she is at term and its unlikely to improve her hgb in time for her C section. -Advised iron takes time to absorb and affect blood counts- would have to give blood transfusion for caesarean section  -Goal Hemoglobin close to 10 for c section - will defer PRBC transfusion to her OB-Gyn team -Will need to check iron levels and labs prior to Caesarean section -Recommend considering blood transfusion for Caesarean section on "Sunday if less than 10 -Recommend replace IV iron after delivery  -Will get labs tomorrow morning with her team  FOLLOW UP RTC with Dr Kale with labs in 2 months    The total time spent in the appt was 45minutes and more than 50% was on counseling and direct patient cares.  All of the patient's questions were answered with apparent satisfaction. The patient knows to call the clinic with any problems, questions or concerns.  Gautam Kale MD MS AAHIVMS [facility] C[facility] Hematology/Oncology Physician [facility]  (Office):       [phone] (Work cell):  [phone] (Fax):           [phone]" [phone]  04/26/2020 3:54 PM  I, 09-22-1987 am acting as a 04/28/2020 for Dr. Dorthula Perfect.   .I have reviewed the above documentation for accuracy and completeness, and I agree with the above. Neurosurgeon MD

## 2020-04-26 ENCOUNTER — Other Ambulatory Visit: Payer: Self-pay

## 2020-04-26 ENCOUNTER — Inpatient Hospital Stay: Payer: 59 | Attending: Hematology | Admitting: Hematology

## 2020-04-26 ENCOUNTER — Inpatient Hospital Stay: Payer: 59

## 2020-04-26 VITALS — BP 107/77 | HR 106 | Temp 97.9°F | Resp 18 | Ht 64.0 in | Wt 154.6 lb

## 2020-04-26 DIAGNOSIS — O34211 Maternal care for low transverse scar from previous cesarean delivery: Secondary | ICD-10-CM | POA: Diagnosis not present

## 2020-04-26 DIAGNOSIS — Z87891 Personal history of nicotine dependence: Secondary | ICD-10-CM | POA: Diagnosis not present

## 2020-04-26 DIAGNOSIS — D508 Other iron deficiency anemias: Secondary | ICD-10-CM | POA: Diagnosis not present

## 2020-04-26 DIAGNOSIS — O9832 Other infections with a predominantly sexual mode of transmission complicating childbirth: Secondary | ICD-10-CM | POA: Diagnosis not present

## 2020-04-26 DIAGNOSIS — O9902 Anemia complicating childbirth: Secondary | ICD-10-CM | POA: Diagnosis not present

## 2020-04-26 DIAGNOSIS — Z302 Encounter for sterilization: Secondary | ICD-10-CM | POA: Diagnosis not present

## 2020-04-26 DIAGNOSIS — Z20822 Contact with and (suspected) exposure to covid-19: Secondary | ICD-10-CM | POA: Diagnosis not present

## 2020-04-26 DIAGNOSIS — A6000 Herpesviral infection of urogenital system, unspecified: Secondary | ICD-10-CM | POA: Diagnosis not present

## 2020-04-26 DIAGNOSIS — D509 Iron deficiency anemia, unspecified: Secondary | ICD-10-CM | POA: Diagnosis not present

## 2020-04-26 DIAGNOSIS — D573 Sickle-cell trait: Secondary | ICD-10-CM | POA: Diagnosis not present

## 2020-04-27 ENCOUNTER — Other Ambulatory Visit: Payer: Self-pay

## 2020-04-27 ENCOUNTER — Other Ambulatory Visit (HOSPITAL_COMMUNITY)
Admission: RE | Admit: 2020-04-27 | Discharge: 2020-04-27 | Disposition: A | Discharge status: Home / Self Care | Payer: 59 | Source: Ambulatory Visit | Attending: Obstetrics & Gynecology | Admitting: Obstetrics & Gynecology

## 2020-04-27 DIAGNOSIS — Z20822 Contact with and (suspected) exposure to covid-19: Secondary | ICD-10-CM | POA: Insufficient documentation

## 2020-04-27 DIAGNOSIS — Z01812 Encounter for preprocedural laboratory examination: Secondary | ICD-10-CM | POA: Insufficient documentation

## 2020-04-27 LAB — CBC
HCT: 30.2 % — ABNORMAL LOW (ref 36.0–46.0)
Hemoglobin: 9.1 g/dL — ABNORMAL LOW (ref 12.0–15.0)
MCH: 21.9 pg — ABNORMAL LOW (ref 26.0–34.0)
MCHC: 30.1 g/dL (ref 30.0–36.0)
MCV: 72.8 fL — ABNORMAL LOW (ref 80.0–100.0)
Platelets: 213 10*3/uL (ref 150–400)
RBC: 4.15 MIL/uL (ref 3.87–5.11)
RDW: 21.3 % — ABNORMAL HIGH (ref 11.5–15.5)
WBC: 9.8 10*3/uL (ref 4.0–10.5)
nRBC: 0.5 % — ABNORMAL HIGH (ref 0.0–0.2)

## 2020-04-27 LAB — TYPE AND SCREEN
ABO/RH(D): O POS
Antibody Screen: NEGATIVE

## 2020-04-27 LAB — ABO/RH: ABO/RH(D): O POS

## 2020-04-27 LAB — SARS CORONAVIRUS 2 (TAT 6-24 HRS): SARS Coronavirus 2: NEGATIVE

## 2020-04-27 LAB — RPR: RPR Ser Ql: NONREACTIVE

## 2020-04-27 NOTE — Pre-Procedure Instructions (Signed)
Left voicemail for Dr Sallye Ober at her request of CBC result.  Hemoglobin 9.8.

## 2020-04-28 NOTE — H&P (Addendum)
Angela Ashley is a 34 y.o. female, G3P2002, IUP at 34 weeks, presenting for Elective repeat CS with BTL with Dr Sallye Ober. Pt endorse + Fm. Denies vaginal leakage. Denies vaginal bleeding. Denies feeling cxt's.   Prenatal Problem: anemia of pregnancy (01/31/20: HGB 9.5) Asthma (As child, no recent/current issues, declines inhaler Rx.) genital herpes simplex (Denies lesion, no prodrom s/sx) history of cesarean section (DR Ambrose Mantle, had active HSV lesion, 2014) history of loop electrosurgical excision procedure (2019, normal pap 2020, continue yearly paps at present. Plan [place] for cervical length at 14-16 weeks, then 20 weeks with anatomy.) sickle cell trait (Check CM9 monthly, FOB status unknown)  Prenatal meds: Feraheme Fusion Plus Valtrex  Patient Active Problem List   Diagnosis Date Noted  . Encounter for maternal care for low transverse scar from previous cesarean delivery 04/29/2020  . Morning sickness 09/26/2019  . Intrauterine pregnancy 09/26/2019  . Subchorionic hemorrhage 09/26/2019  . Bacterial vaginosis 06/20/2015     Medications Prior to Admission  Medication Sig Dispense Refill Last Dose  . prenatal vitamin w/FE, FA (PRENATAL 1 + 1) 27-1 MG TABS tablet Take 1 tablet by mouth at bedtime.        Past Medical History:  Diagnosis Date  . Abnormal Pap smear   . Asthma    no inhaler  . Bacterial vaginosis    reoccuring  . Chlamydia   . Herpes   . Sickle cell trait (HCC)      No current facility-administered medications on file prior to encounter.   Current Outpatient Medications on File Prior to Encounter  Medication Sig Dispense Refill  . prenatal vitamin w/FE, FA (PRENATAL 1 + 1) 27-1 MG TABS tablet Take 1 tablet by mouth at bedtime.        No Known Allergies  History of present pregnancy: Pt Info/Preference:  Screening/Consents:  Labs:   EDD: Estimated Date of Delivery: 05/06/20  Establised: Patient's last menstrual period was 07/31/2019.  Anatomy Scan: Date:  12/21/2019 Placenta Location: posterior Genetic Screen: Panoroma:low risk female AFP:  First Tri: Quad:  Office: ccob            Md: DR Sallye Ober First PNV: 9 wg Blood Type --/--/O POS, O POS Performed at [facility], [address] [place], [place] [address]  [phone]/28 [phone])  Language: [place] Last PNV: 38.4 wg Rhogam    Flu Vaccine:  UTD   Antibody NEG (05/28 0802)  TDaP vaccine utd   GTT: Early: N/A Third Trimester: Neg  Feeding Plan: breat BTL: Desires consent to be signed by Dr Sallye Ober Rubella: Immune (11/02 0000)  Contraception: BTL  VBAC: declined RPR: NON REACTIVE (05/28 0757)   Circumcision: In pt   HBsAg: Negative (11/02 0000)  Pediatrician:  ???   HIV: Non-reactive (11/02 0000)   Prenatal Classes: no Additional [place]: Growth 2/26 2.2lbs GBS:  (For PCN allergy, check sensitivities)       Chlamydia: neg    MFM Referral/Consult:  GC: neg  Support Person: husband   PAP: 2020-normal  Pain Management: spinal Neonatologist Referral:  Hgb Electrophoresis:  AS  Birth Plan: RCS   Hgb NOB: 14.2    28W: 9.5   OB History    Gravida  3   Para  2   Term  2   Preterm      AB      Living  2     SAB      TAB      Ectopic  Multiple      Live Births  1          Past Medical History:  Diagnosis Date  . Abnormal Pap smear   . Asthma    no inhaler  . Bacterial vaginosis    reoccuring  . Chlamydia   . Herpes   . Sickle cell trait [facility])    Past Surgical History:  Procedure Laterality Date  . CESAREAN SECTION N/A 03/26/2013   Procedure: Primary CESAREAN SECTION of baby girl  at  2051  APGAR 9/9;  Surgeon: Bing Plume, MD;  Location: [facility];  Service: Obstetrics;  Laterality: N/A;  . COLPOSCOPY W/ BIOPSY / CURETTAGE     Dr. in [place]   Family History: family history includes Diabetes in her paternal grandmother; Healthy in her father and mother; Hypertension in her maternal grandmother. Social History:  reports that she has quit smoking. Her smoking use  included cigarettes. She has never used smokeless tobacco. She reports previous alcohol use. She reports that she does not use drugs.   Prenatal Transfer Tool  Maternal Diabetes: No Genetic Screening: Normal Maternal Ultrasounds/Referrals: Normal Fetal Ultrasounds or other Referrals:  None Maternal Substance Abuse:  No Significant Maternal Medications:  Meds include: Other:   Feraheme Fusion Plus Valtrex Significant Maternal Lab Results: Group B Strep negative, HSV positive  ROS:  Review of Systems  Constitutional: Negative.   HENT: Negative.   Eyes: Negative.   Respiratory: Negative.   Cardiovascular: Negative.   Gastrointestinal: Negative.   Genitourinary: Negative.   Musculoskeletal: Negative.   Skin: Negative.   Neurological: Negative.   Endo/Heme/Allergies: Negative.   Psychiatric/Behavioral: Negative.      Physical Exam: BP 115/74 (BP Location: Left Arm)   Pulse 84   Temp 98.3 F (36.8 C) (Oral)   Resp 16   Ht 5' (1.524 m) Comment: Previous entry of 5'4" is incorrect, per pt  Wt 69.4 kg   LMP 07/31/2019   BMI 29.88 kg/m   Physical Exam  Constitutional: She is oriented to person, place, and time and well-developed, well-nourished, and in no distress.  HENT:  Head: Normocephalic and atraumatic.  Eyes: Pupils are equal, round, and reactive to light. Conjunctivae are normal.  Cardiovascular: Normal rate and regular rhythm.  Pulmonary/Chest: Effort normal and breath sounds normal.  Abdominal: Soft. Bowel sounds are normal.  Genitourinary:    Genitourinary Comments: Deferred exam, No external lesions.    Musculoskeletal:        General: Normal range of motion.     Cervical back: Normal range of motion.  Neurological: She is alert and oriented to person, place, and time. Gait normal.  Skin: Skin is warm and dry.  Psychiatric: Affect normal.  Nursing note and vitals reviewed.    FHT: 150 UC:   none SVE:    , Deferred    Labs: No results found for this  or any previous visit (from the past 24 hour(s)).  Imaging:  No results found.  MAU Course: Orders Placed This Encounter  Procedures  . Informed Consent Details: Physician/Practitioner Attestation; Transcribe to consent form and obtain patient signature  . Initiate Pre-op Protocol  . Pre-admission testing diagnosis  . Clip operative site  . SCD's  . Insert peripheral IV  . Admit to Inpatient (patient's expected length of stay will be greater than 2 midnights or inpatient only procedure)   Meds ordered this encounter  Medications  . ceFAZolin (ANCEF) IVPB 2g/100 mL premix    Order Specific  Question:   Indication:    Answer:   Surgical Prophylaxis    Assessment/Plan: Angela Ashley is a 34 y.o. female, G3P2002, IUP at 8 weeks, presenting for Elective repeat CS with BTL with Dr Alesia Richards. Pt endorse + Fm. Denies vaginal leakage. Denies vaginal bleeding. Denies feeling cxt's.   Prenatal Problem: anemia of pregnancy (01/31/20: HGB 9.5) Asthma (As child, no recent/current issues, declines inhaler Rx.) genital herpes simplex (Denies lesion, no prodrom s/sx) history of cesarean section (DR Ulanda Edison, had active HSV lesion, 2014) history of loop electrosurgical excision procedure (2019, normal pap 2020, continue yearly paps at present. Plan [place] for cervical length at 14-16 weeks, then 20 weeks with anatomy.) sickle cell trait (Check CM9 monthly, FOB status unknown)  Plan: Admit to PreOp per consult with Dr Alesia Richards Routine pre-op [facility] orders Spinal epidural SCD Ancef 2gm Bicitra Shave BTL RCS  [facility] NP-C, CNM, MSN 04/29/2020, 10:35 AM

## 2020-04-29 ENCOUNTER — Encounter (HOSPITAL_COMMUNITY)
Admission: RE | Disposition: A | Discharge status: Home / Self Care | Payer: Self-pay | Source: Home / Self Care | Attending: Obstetrics & Gynecology

## 2020-04-29 ENCOUNTER — Inpatient Hospital Stay (HOSPITAL_COMMUNITY): Payer: 59 | Admitting: Anesthesiology

## 2020-04-29 ENCOUNTER — Encounter (HOSPITAL_COMMUNITY): Payer: Self-pay | Admitting: Obstetrics & Gynecology

## 2020-04-29 ENCOUNTER — Inpatient Hospital Stay (HOSPITAL_COMMUNITY)
Admission: RE | Admit: 2020-04-29 | Discharge: 2020-05-01 | DRG: 784 | Disposition: A | Discharge status: Home / Self Care | Payer: 59 | Attending: Obstetrics & Gynecology | Admitting: Obstetrics & Gynecology

## 2020-04-29 ENCOUNTER — Other Ambulatory Visit: Payer: Self-pay

## 2020-04-29 DIAGNOSIS — Z87891 Personal history of nicotine dependence: Secondary | ICD-10-CM

## 2020-04-29 DIAGNOSIS — A6000 Herpesviral infection of urogenital system, unspecified: Secondary | ICD-10-CM | POA: Diagnosis present

## 2020-04-29 DIAGNOSIS — Z20822 Contact with and (suspected) exposure to covid-19: Secondary | ICD-10-CM | POA: Diagnosis present

## 2020-04-29 DIAGNOSIS — D509 Iron deficiency anemia, unspecified: Secondary | ICD-10-CM | POA: Diagnosis present

## 2020-04-29 DIAGNOSIS — O9902 Anemia complicating childbirth: Secondary | ICD-10-CM | POA: Diagnosis present

## 2020-04-29 DIAGNOSIS — O34211 Maternal care for low transverse scar from previous cesarean delivery: Principal | ICD-10-CM | POA: Diagnosis present

## 2020-04-29 DIAGNOSIS — Z9851 Tubal ligation status: Secondary | ICD-10-CM

## 2020-04-29 DIAGNOSIS — O9832 Other infections with a predominantly sexual mode of transmission complicating childbirth: Secondary | ICD-10-CM | POA: Diagnosis present

## 2020-04-29 DIAGNOSIS — D573 Sickle-cell trait: Secondary | ICD-10-CM | POA: Diagnosis present

## 2020-04-29 DIAGNOSIS — O9952 Diseases of the respiratory system complicating childbirth: Secondary | ICD-10-CM | POA: Diagnosis not present

## 2020-04-29 DIAGNOSIS — Z302 Encounter for sterilization: Secondary | ICD-10-CM | POA: Diagnosis not present

## 2020-04-29 DIAGNOSIS — O34219 Maternal care for unspecified type scar from previous cesarean delivery: Secondary | ICD-10-CM | POA: Diagnosis present

## 2020-04-29 DIAGNOSIS — Z98891 History of uterine scar from previous surgery: Secondary | ICD-10-CM

## 2020-04-29 DIAGNOSIS — J45909 Unspecified asthma, uncomplicated: Secondary | ICD-10-CM | POA: Diagnosis not present

## 2020-04-29 DIAGNOSIS — Z3A00 Weeks of gestation of pregnancy not specified: Secondary | ICD-10-CM | POA: Diagnosis not present

## 2020-04-29 DIAGNOSIS — Z3A39 39 weeks gestation of pregnancy: Secondary | ICD-10-CM

## 2020-04-29 SURGERY — Surgical Case
Anesthesia: Spinal | Site: Abdomen | Laterality: Bilateral | Wound class: Clean Contaminated

## 2020-04-29 MED ORDER — DIPHENHYDRAMINE HCL 25 MG PO CAPS: 25.0000 mg | ORAL_CAPSULE | ORAL | Status: DC | PRN

## 2020-04-29 MED ORDER — DIPHENHYDRAMINE HCL 50 MG/ML IJ SOLN: 12.5000 mg | INTRAMUSCULAR | Status: DC | PRN

## 2020-04-29 MED ORDER — ONDANSETRON HCL 4 MG/2ML IJ SOLN
INTRAMUSCULAR | Status: AC
  Filled 2020-04-29: qty 2

## 2020-04-29 MED ORDER — SIMETHICONE 80 MG PO CHEW: 80.0000 mg | CHEWABLE_TABLET | ORAL | Status: DC | PRN

## 2020-04-29 MED ORDER — SIMETHICONE 80 MG PO CHEW
80.0000 mg | CHEWABLE_TABLET | Freq: Three times a day (TID) | ORAL | Status: DC
  Administered 2020-04-29 – 2020-05-01 (×6): 80 mg via ORAL
  Filled 2020-04-29 (×6): qty 1

## 2020-04-29 MED ORDER — PHENYLEPHRINE HCL-NACL 20-0.9 MG/250ML-% IV SOLN
INTRAVENOUS | Status: AC
  Filled 2020-04-29: qty 250

## 2020-04-29 MED ORDER — IBUPROFEN 800 MG PO TABS: 800.0000 mg | ORAL_TABLET | Freq: Three times a day (TID) | ORAL | Status: DC

## 2020-04-29 MED ORDER — PROMETHAZINE HCL 25 MG/ML IJ SOLN: 6.2500 mg | INTRAMUSCULAR | Status: DC | PRN

## 2020-04-29 MED ORDER — DEXAMETHASONE SODIUM PHOSPHATE 10 MG/ML IJ SOLN
INTRAMUSCULAR | Status: DC | PRN
  Administered 2020-04-29: 10 mg via INTRAVENOUS

## 2020-04-29 MED ORDER — LACTATED RINGERS IV SOLN: INTRAVENOUS | Status: DC | PRN

## 2020-04-29 MED ORDER — BUPIVACAINE IN DEXTROSE 0.75-8.25 % IT SOLN
INTRATHECAL | Status: DC | PRN
  Administered 2020-04-29: 1.6 mL via INTRATHECAL

## 2020-04-29 MED ORDER — MORPHINE SULFATE (PF) 0.5 MG/ML IJ SOLN
INTRAMUSCULAR | Status: DC | PRN
  Administered 2020-04-29: 150 ug via EPIDURAL

## 2020-04-29 MED ORDER — LACTATED RINGERS IV SOLN: INTRAVENOUS | Status: DC

## 2020-04-29 MED ORDER — DIBUCAINE (PERIANAL) 1 % EX OINT: 1.0000 "application " | TOPICAL_OINTMENT | CUTANEOUS | Status: DC | PRN

## 2020-04-29 MED ORDER — IBUPROFEN 100 MG/5ML PO SUSP
800.0000 mg | Freq: Three times a day (TID) | ORAL | Status: DC
  Administered 2020-04-29 – 2020-04-30 (×3): 800 mg via ORAL
  Filled 2020-04-29 (×3): qty 40

## 2020-04-29 MED ORDER — PRENATAL MULTIVITAMIN CH
1.0000 | ORAL_TABLET | Freq: Every day | ORAL | Status: DC
  Administered 2020-04-30 – 2020-05-01 (×2): 1 via ORAL
  Filled 2020-04-29 (×2): qty 1

## 2020-04-29 MED ORDER — CEFAZOLIN SODIUM-DEXTROSE 2-4 GM/100ML-% IV SOLN
2.0000 g | INTRAVENOUS | Status: AC
  Administered 2020-04-29: 2 g via INTRAVENOUS

## 2020-04-29 MED ORDER — PHENYLEPHRINE HCL-NACL 20-0.9 MG/250ML-% IV SOLN
INTRAVENOUS | Status: DC | PRN
  Administered 2020-04-29: 60 ug/min via INTRAVENOUS

## 2020-04-29 MED ORDER — DIPHENHYDRAMINE HCL 25 MG PO CAPS: 25.0000 mg | ORAL_CAPSULE | Freq: Four times a day (QID) | ORAL | Status: DC | PRN

## 2020-04-29 MED ORDER — SODIUM CHLORIDE 0.9% FLUSH: 3.0000 mL | INTRAVENOUS | Status: DC | PRN

## 2020-04-29 MED ORDER — OXYTOCIN 40 UNITS IN NORMAL SALINE INFUSION - SIMPLE MED
INTRAVENOUS | Status: DC | PRN
  Administered 2020-04-29: 40 [IU] via INTRAVENOUS

## 2020-04-29 MED ORDER — MEPERIDINE HCL 25 MG/ML IJ SOLN: 6.2500 mg | INTRAMUSCULAR | Status: DC | PRN

## 2020-04-29 MED ORDER — COCONUT OIL OIL: 1.0000 "application " | TOPICAL_OIL | Status: DC | PRN

## 2020-04-29 MED ORDER — SENNOSIDES-DOCUSATE SODIUM 8.6-50 MG PO TABS
2.0000 | ORAL_TABLET | ORAL | Status: DC
  Administered 2020-04-29 – 2020-04-30 (×2): 2 via ORAL
  Filled 2020-04-29 (×2): qty 2

## 2020-04-29 MED ORDER — CEFAZOLIN SODIUM-DEXTROSE 2-4 GM/100ML-% IV SOLN
INTRAVENOUS | Status: AC
  Filled 2020-04-29: qty 100

## 2020-04-29 MED ORDER — MISOPROSTOL 200 MCG PO TABS: 800.0000 ug | ORAL_TABLET | Freq: Once | ORAL | Status: DC

## 2020-04-29 MED ORDER — OXYCODONE HCL 5 MG/5ML PO SOLN: 5.0000 mg | Freq: Once | ORAL | Status: DC | PRN

## 2020-04-29 MED ORDER — NALBUPHINE HCL 10 MG/ML IJ SOLN: 5.0000 mg | Freq: Once | INTRAMUSCULAR | Status: DC | PRN

## 2020-04-29 MED ORDER — NALOXONE HCL 0.4 MG/ML IJ SOLN: 0.4000 mg | INTRAMUSCULAR | Status: DC | PRN

## 2020-04-29 MED ORDER — ACETAMINOPHEN 325 MG PO TABS
650.0000 mg | ORAL_TABLET | ORAL | Status: DC | PRN
  Administered 2020-04-30 – 2020-05-01 (×3): 650 mg via ORAL
  Filled 2020-04-29 (×3): qty 2

## 2020-04-29 MED ORDER — SIMETHICONE 80 MG PO CHEW
80.0000 mg | CHEWABLE_TABLET | ORAL | Status: DC
  Administered 2020-04-29 – 2020-04-30 (×2): 80 mg via ORAL
  Filled 2020-04-29 (×2): qty 1

## 2020-04-29 MED ORDER — FENTANYL CITRATE (PF) 100 MCG/2ML IJ SOLN
INTRAMUSCULAR | Status: DC | PRN
  Administered 2020-04-29: 15 ug via INTRAVENOUS

## 2020-04-29 MED ORDER — MORPHINE SULFATE (PF) 0.5 MG/ML IJ SOLN
INTRAMUSCULAR | Status: AC
  Filled 2020-04-29: qty 10

## 2020-04-29 MED ORDER — SODIUM CHLORIDE 0.9 % IR SOLN
Status: DC | PRN
  Administered 2020-04-29: 1000 mL

## 2020-04-29 MED ORDER — MENTHOL 3 MG MT LOZG: 1.0000 | LOZENGE | OROMUCOSAL | Status: DC | PRN

## 2020-04-29 MED ORDER — ZOLPIDEM TARTRATE 5 MG PO TABS: 5.0000 mg | ORAL_TABLET | Freq: Every evening | ORAL | Status: DC | PRN

## 2020-04-29 MED ORDER — OXYTOCIN 40 UNITS IN NORMAL SALINE INFUSION - SIMPLE MED
INTRAVENOUS | Status: AC
  Filled 2020-04-29: qty 1000

## 2020-04-29 MED ORDER — ONDANSETRON HCL 4 MG/2ML IJ SOLN
INTRAMUSCULAR | Status: DC | PRN
  Administered 2020-04-29: 4 mg via INTRAVENOUS

## 2020-04-29 MED ORDER — KETOROLAC TROMETHAMINE 30 MG/ML IJ SOLN: 30.0000 mg | Freq: Once | INTRAMUSCULAR | Status: DC | PRN

## 2020-04-29 MED ORDER — OXYCODONE-ACETAMINOPHEN 5-325 MG PO TABS
1.0000 | ORAL_TABLET | ORAL | Status: DC | PRN
  Administered 2020-04-30 – 2020-05-01 (×3): 1 via ORAL
  Filled 2020-04-29 (×3): qty 1

## 2020-04-29 MED ORDER — OXYCODONE HCL 5 MG PO TABS: 5.0000 mg | ORAL_TABLET | Freq: Once | ORAL | Status: DC | PRN

## 2020-04-29 MED ORDER — MISOPROSTOL 200 MCG PO TABS
800.0000 ug | ORAL_TABLET | Freq: Once | ORAL | Status: AC
  Administered 2020-04-29: 800 ug via VAGINAL
  Filled 2020-04-29: qty 4

## 2020-04-29 MED ORDER — FENTANYL CITRATE (PF) 100 MCG/2ML IJ SOLN
INTRAMUSCULAR | Status: DC | PRN
  Administered 2020-04-29: 50 ug via INTRAVENOUS
  Administered 2020-04-29: 35 ug via INTRAVENOUS

## 2020-04-29 MED ORDER — SODIUM CHLORIDE 0.9 % IV SOLN: INTRAVENOUS | Status: DC | PRN

## 2020-04-29 MED ORDER — SCOPOLAMINE 1 MG/3DAYS TD PT72: 1.0000 | MEDICATED_PATCH | Freq: Once | TRANSDERMAL | Status: DC

## 2020-04-29 MED ORDER — OXYCODONE-ACETAMINOPHEN 5-325 MG PO TABS: 2.0000 | ORAL_TABLET | ORAL | Status: DC | PRN

## 2020-04-29 MED ORDER — ONDANSETRON HCL 4 MG/2ML IJ SOLN: 4.0000 mg | Freq: Three times a day (TID) | INTRAMUSCULAR | Status: DC | PRN

## 2020-04-29 MED ORDER — NALBUPHINE HCL 10 MG/ML IJ SOLN: 5.0000 mg | INTRAMUSCULAR | Status: DC | PRN

## 2020-04-29 MED ORDER — STERILE WATER FOR IRRIGATION IR SOLN
Status: DC | PRN
  Administered 2020-04-29: 1000 mL

## 2020-04-29 MED ORDER — NALOXONE HCL 4 MG/10ML IJ SOLN
1.0000 ug/kg/h | INTRAVENOUS | Status: DC | PRN
  Filled 2020-04-29: qty 5

## 2020-04-29 MED ORDER — HYDROMORPHONE HCL 1 MG/ML IJ SOLN: 0.2500 mg | INTRAMUSCULAR | Status: DC | PRN

## 2020-04-29 MED ORDER — OXYTOCIN 40 UNITS IN NORMAL SALINE INFUSION - SIMPLE MED: 2.5000 [IU]/h | INTRAVENOUS | Status: AC

## 2020-04-29 MED ORDER — FENTANYL CITRATE (PF) 100 MCG/2ML IJ SOLN
INTRAMUSCULAR | Status: AC
  Filled 2020-04-29: qty 2

## 2020-04-29 MED ORDER — WITCH HAZEL-GLYCERIN EX PADS: 1.0000 "application " | MEDICATED_PAD | CUTANEOUS | Status: DC | PRN

## 2020-04-29 MED ORDER — DEXAMETHASONE SODIUM PHOSPHATE 10 MG/ML IJ SOLN
INTRAMUSCULAR | Status: AC
  Filled 2020-04-29: qty 1

## 2020-04-29 SURGICAL SUPPLY — 32 items
BENZOIN TINCTURE PRP APPL 2/3 (GAUZE/BANDAGES/DRESSINGS) ×2 IMPLANT
CLAMP CORD UMBIL (MISCELLANEOUS) ×2 IMPLANT
CLOTH BEACON ORANGE TIMEOUT ST (SAFETY) ×2 IMPLANT
DERMABOND ADVANCED (GAUZE/BANDAGES/DRESSINGS) ×1
DERMABOND ADVANCED .7 DNX12 (GAUZE/BANDAGES/DRESSINGS) ×1 IMPLANT
DRAPE C SECTION CLR SCREEN (DRAPES) ×2 IMPLANT
DRSG OPSITE POSTOP 4X10 (GAUZE/BANDAGES/DRESSINGS) ×2 IMPLANT
ELECT REM PT RETURN 9FT ADLT (ELECTROSURGICAL) ×2
ELECTRODE REM PT RTRN 9FT ADLT (ELECTROSURGICAL) ×1 IMPLANT
GLOVE BIOGEL PI IND STRL 7.0 (GLOVE) ×2 IMPLANT
GLOVE BIOGEL PI INDICATOR 7.0 (GLOVE) ×2
GLOVE SURG SS PI 6.5 STRL IVOR (GLOVE) ×2 IMPLANT
GOWN STRL REUS W/TWL LRG LVL3 (GOWN DISPOSABLE) ×4 IMPLANT
NS IRRIG 1000ML POUR BTL (IV SOLUTION) ×2 IMPLANT
PACK C SECTION WH (CUSTOM PROCEDURE TRAY) ×2 IMPLANT
PAD OB MATERNITY 4.3X12.25 (PERSONAL CARE ITEMS) ×2 IMPLANT
PENCIL SMOKE EVAC W/HOLSTER (ELECTROSURGICAL) ×2 IMPLANT
RTRCTR C-SECT PINK 25CM LRG (MISCELLANEOUS) ×2 IMPLANT
STRIP CLOSURE SKIN 1/2X4 (GAUZE/BANDAGES/DRESSINGS) ×2 IMPLANT
SUT CHROMIC 1 CTX 36 (SUTURE) ×8 IMPLANT
SUT CHROMIC 2 0 CT 1 (SUTURE) ×2 IMPLANT
SUT MON AB 4-0 PS1 27 (SUTURE) ×2 IMPLANT
SUT PLAIN 1 NONE 54 (SUTURE) ×2 IMPLANT
SUT PLAIN 2 0 (SUTURE) ×1
SUT PLAIN ABS 2-0 CT1 27XMFL (SUTURE) ×1 IMPLANT
SUT VIC AB 0 CTX 36 (SUTURE) ×2
SUT VIC AB 0 CTX36XBRD ANBCTRL (SUTURE) ×2 IMPLANT
SUT VIC AB 1 CTX 36 (SUTURE) ×2
SUT VIC AB 1 CTX36XBRD ANBCTRL (SUTURE) ×2 IMPLANT
TOWEL OR 17X24 6PK STRL BLUE (TOWEL DISPOSABLE) ×2 IMPLANT
TRAY FOLEY W/BAG SLVR 14FR LF (SET/KITS/TRAYS/PACK) ×2 IMPLANT
WATER STERILE IRR 1000ML POUR (IV SOLUTION) ×2 IMPLANT

## 2020-04-29 NOTE — Interval H&P Note (Signed)
History and Physical Interval Note:  04/29/2020 10:41 AM  Angela Ashley  has presented today for surgery, with the diagnosis of Prior cesarean Section, desires permanent sterilization.  The various methods of treatment have been discussed with the patient and family. After consideration of risks, benefits and other options for treatment, the patient has consented to  Procedure(s): CESAREAN SECTION WITH BILATERAL TUBAL LIGATION (Bilateral) VIA COMPLETE BILATERAL SALPINGECTOMY as a surgical intervention.  The patient's history has been reviewed, patient examined, no change in status, stable for surgery.  I have reviewed the patient's chart and labs.  Questions were answered to the patient's satisfaction.    CBC    Component Value Date/Time   WBC 9.8 04/27/2020 0757   RBC 4.15 04/27/2020 0757   HGB 9.1 (L) 04/27/2020 0757   HGB 14.2 02/11/2017 1503   HCT 30.2 (L) 04/27/2020 0757   HCT 42.9 02/11/2017 1503   PLT 213 04/27/2020 0757   PLT 303 02/11/2017 1503   MCV 72.8 (L) 04/27/2020 0757   MCV 86 02/11/2017 1503   MCH 21.9 (L) 04/27/2020 0757   MCHC 30.1 04/27/2020 0757   RDW 21.3 (H) 04/27/2020 0757   RDW 13.0 02/11/2017 1503   LYMPHSABS 2.6 02/11/2017 1503   MONOABS 0.6 09/19/2013 1518   EOSABS 0.2 02/11/2017 1503   BASOSABS 0.1 02/11/2017 1503    [name], MD.   04/29/2020

## 2020-04-29 NOTE — Transfer of Care (Signed)
Immediate Anesthesia Transfer of Care Note  Patient: Angela Ashley  Procedure(s) Performed: CESAREAN SECTION WITH BILATERAL TUBAL LIGATION (Bilateral Abdomen)  Patient Location: PACU  Anesthesia Type:Spinal  Level of Consciousness: awake  Airway & Oxygen Therapy: Patient Spontanous Breathing  Post-op Assessment: Report given to RN  Post vital signs: Reviewed and stable  Last Vitals:  Vitals Value Taken Time  BP 132/96 04/29/20 1302  Temp    Pulse 90 04/29/20 1304  Resp 18 04/29/20 1304  SpO2 99 % 04/29/20 1304  Vitals shown include unvalidated device data.  Last Pain:  Vitals:   04/29/20 0948  TempSrc:   PainSc: 0-No pain         Complications: No apparent anesthesia complications

## 2020-04-29 NOTE — Anesthesia Procedure Notes (Signed)
Spinal  Patient location during procedure: OB Start time: 04/29/2020 11:01 AM End time: 04/29/2020 11:06 AM Staffing Performed: anesthesiologist  Anesthesiologist: Lowella Curb, MD Preanesthetic Checklist Completed: patient identified, IV checked, risks and benefits discussed, surgical consent, monitors and equipment checked, pre-op evaluation and timeout performed Spinal Block Patient position: sitting Prep: DuraPrep and site prepped and draped Patient monitoring: heart rate, cardiac monitor, continuous pulse ox and blood pressure Approach: midline Location: L3-4 Injection technique: single-shot Needle Needle type: Pencan  Needle gauge: 24 G Needle length: 10 cm Assessment Sensory level: T4

## 2020-04-29 NOTE — Progress Notes (Addendum)
  Subjective: Postpartum Day 0: Cesarean Delivery Patient reports abdominal pain on right mid area, 7/10 intensity, Last used fentanyl for pain about 2 hours ago.  She has had scant vaginal bleeding after the cesarean section.     Objective: Vital signs in last 24 hours: Temp:  [97.5 F (36.4 C)-98.3 F (36.8 C)] 97.5 F (36.4 C) (05/30 1305) Pulse Rate:  [84-105] 101 (05/30 1345) Resp:  [16-23] 23 (05/30 1345) BP: (95-132)/(74-96) 95/74 (05/30 1345) SpO2:  [88 %-99 %] 99 % (05/30 1345) Weight:  [69.4 kg] 69.4 kg (05/30 0938)  Physical Exam:  General: alert, cooperative and no distress Lochia: small amount Uterine Fundus: firm.  Firmness on the right side of mid abdomen, ultrasound done of the area (see below).  Incision: With honey comb dressing.  Small blood noted on mid lower part of incision.  DVT Evaluation: No evidence of DVT seen on physical exam. Peripad: small dark red blood on peripad.    Bedside ultrasound: Uterus oriented more to patient's right side of abdomen.  Endometrium appears within normal limits for postpartum period, endometrial lining is moderately thick but without focal collections.      Recent Labs    04/27/20 0757  HGB 9.1*  HCT 30.2*   Assessment/Plan: Status post Cesarean section. Doing well postoperatively.  -Discussed that pain on her right abdomen is likely due to orientation of her uterus. We discussed likely uterus . -Will give vaginal cytotec to aid in cervical opening for more vaginal blood egress. -Pain medication as ordered.  -CBC follow up in the AM. -Routine post-op care as ordered.  Konrad Felix, MD.  04/29/2020, 3:02 PM

## 2020-04-29 NOTE — Op Note (Addendum)
Patient: Angela Ashley. [name] DOB: 07/14/1986 MRN:  [id number]  DATE OF SURGERY:  04/29/2020  PREOP DIAGNOSIS:  1. [redacted] week EGA IUP. 2.  History of 1 prior cesarean section and desiring a repeat cesarean delivery, she declines trial of labor and vaginal birth after a cesarean section. 3. Multiparous patient desiring permanent sterilization via complete bilateral salpingectomy.  2. Anemia with a pre-op hemoglobin of 9.1.   POSTOP DIAGNOSIS: Same as above above.   PROCEDURE:  1. Repeat low uterine segment transverse cesarean section via Pfannenstiel incision.   2.  Postpartum tubal ligation via bilateral complete salpingectomy.   SURGEON: Dr.  Waymon Amato  ASSISTANT: Noralyn Pick, [place].  ANESTHESIA: Spinal.   COMPLICATIONS: None.   FINDINGS: Viable female infant in cephalic presentation, DOP, weight pending, Apgar scores of 9 and 9. Normal uterus and bilateral fallopian tubes.  Normal left and right ovary.      EBL:   535 cc  IV FLUID:  1500 cc LR   URINE OUTPUT: 200 cc yellow urine  INDICATIONS:  34 y/o P3 who presented for a repeat cesarean section and postpartum bilateral tubal ligation.  She was consented for the procedures after explaining risks benefits and alternatives of the procedures including but not limited to risks of heavy bleeding, infection and damage to organs.  She desired complete removal of both tubes for sterilization.  She did not desires other birth control options like partial removal of tubes, birth control pills, depo provera, IUD, vaginal ring or patches.    PROCEDURE:   Informed consent was obtained from the patient to undergo the procedure. She was taken to the operating room where her spinal anesthesia was found to be adequate. She was prepped and draped in the usual sterile fashion and a Foley catheter was placed. She received IV ancef preoperatively. A Pfannenstiel incision was made with the scalpel over the prior incision and the incision extended  through the subcutaneous layer and also the fascia with the bovie. Small perforators in the subcutaneous layer were contained with the Bovie. The fascia was nicked in the midline and then was further separated from the rectus muscles bilaterally using Mayo scissors. Kochers were placed inferiorly and then superiorly to allow further separation of fascia from the rectus muscles.  The peritoneal cavity was entered bluntly with the fingers. The Alexis retractor was placed in. The bladder flap was created using Metzenbaum scissors.   The uterus was incised with a scalpel and the incision extended bluntly bilaterally with fingers.  Membranes were ruptured and moderate clear amniotic fluid was noted.  The head then the rest of the body was then delivered with abdominal pressure.  She delivered a viable female infant, apgar scores 9, 9.  The edges of the uterus was grasped with rIng forceps.  The cord was clamped and cut after 1 minute. Cord blood was collected.    The uterus could not fit through the incision and exteriorized therefore it was left in situ.  The placenta was delivered with gentle traction on the umbilical cord.  The uterus was cleared of clots and debris with a lap.  The uterine incision was closed with #1 Vicryl in a running locked stitch. An imbricating layer of the same stitch was placed over the initial closure.  A small area that bled on the left side was contained with figure of 8 stitch. A lap was placed over the incision and attention turned to the adnexa.   The right  fallopian tube  was identified and followed up to the fimbria end.    A kelly clamp was placed on the mesosalpinx below the tube and another one below it, and the region between the clamps bovied to isolate the tube from the mesosalpinx.  The clampled mesosalpinx pedicle was sutured with 2-0 chromic below the clamp.   These steps were successively done until the whole tube was isolated up to the cornual end.    A similar procedure  was done to remove the left fallopian tube.  Both uterine cornua and remaining mesosalpinx were hemostatic.  Irrigation was applied and suctioned out. Excellent hemostasis was noted over the uterine incision.   The muscles and peritoneum were then reapproximated using 2-0 chromic suture.  Fascia was closed using 0 Vicryl in a running stitch. The subcutaneous layer was irrigated and suctioned out. Small perforators were contained with the bovie.  The subcutaneous layer was closed using 1-0 plain in interrupted stitches. The skin was closed using 4-0 Monocryl. Benzocaine and steri strips were applied.  Honeycomb was then applied. The patient was then cleaned and she was taken to the recovery room with her baby in stable conditions.   SPECIMEN: Placenta to labor and delivery, umbilical cord blood to lab.   DISPOSITION: TO PACU, STABLE.   Dr. Sallye Ober.   04/29/2020.

## 2020-04-29 NOTE — Anesthesia Postprocedure Evaluation (Signed)
Anesthesia Post Note  Patient: Angela Ashley  Procedure(s) Performed: CESAREAN SECTION WITH BILATERAL TUBAL LIGATION (Bilateral Abdomen)     Patient location during evaluation: PACU Anesthesia Type: Spinal Level of consciousness: awake and alert Pain management: pain level controlled Vital Signs Assessment: post-procedure vital signs reviewed and stable Respiratory status: spontaneous breathing, nonlabored ventilation and respiratory function stable Cardiovascular status: blood pressure returned to baseline and stable Postop Assessment: no apparent nausea or vomiting Anesthetic complications: no    Last Vitals:  Vitals:   04/29/20 1330 04/29/20 1345  BP: 107/77 95/74  Pulse: 99 (!) 101  Resp: 17 (!) 23  Temp:    SpO2: 98% 99%    Last Pain:  Vitals:   04/29/20 1330  TempSrc:   PainSc: 3    Pain Goal:                   Lowella Curb

## 2020-04-29 NOTE — Lactation Note (Signed)
This note was copied from a baby's chart. Lactation Consultation Note  Patient Name: Angela Ashley [id number] Date: 04/29/2020 Reason for consult: Initial assessment;1st time breastfeeding P3, 10 hour term female infant. Infant had one void since birth. Mom is [name] form given, mom will let LC services knows which pump she desires after going on [employer]. Mom's feeding choice at admission is breast and formula feeding. Per mom, this is her 1st time breastfeeding, 1st and 2nd she pumped only and gave formula. Tools given: hand pump to pre-pump breast prior to latching infant due to having flat nipples and given DEBP by RN.  Per mom, she has breastfed infant twice 1st time-15 minutes and 2nd time 10 minutes and 3rd infant was formula only 5 mls. Per mom, infant is sleepy and not trying to latch at this time, it is time for a feeding. LC discussed ways to wake infant to breastfeed. LC discussed hand expression, and mom taught back expressing 2 mls EBP that was spoon fed to infant, afterwards infant started cuing to breastfed. LC ask mom to do breast stimulation and pre-pump breast prior to latching infant, mom latched infant on her right breast using the football hold position. After a few attempts infant finally formed a seal around mom's breast and sustained latch, infant was still breastfeeding after 12 minutes when LC left room. Mom knows to breastfeed according to hunger cues, 8 to 12 times within 24 hours and not exceed 3 hours. Mom knows to call RN or LC if she has any questions, concerns or need assistance with latch. Mom will delay offering formula until milk supply is established. Mom plans to use DEBP every other feeding, her choice.   Mom made aware of O/P services, breastfeeding support groups, community resources, and our phone # for post-discharge questions.    Maternal Data Formula Feeding for Exclusion: Yes Reason for exclusion: Mother's choice  to formula and breast feed on admission Has patient been taught Hand Expression?: Yes Does the patient have breastfeeding experience prior to this delivery?: Yes  Feeding Feeding Type: Breast Fed Nipple Type: Slow - flow  LATCH Score Latch: Repeated attempts needed to sustain latch, nipple held in mouth throughout feeding, stimulation needed to elicit sucking reflex.  Audible Swallowing: Spontaneous and intermittent  Type of Nipple: Flat  Comfort (Breast/Nipple): Soft / non-tender  Hold (Positioning): Assistance needed to correctly position infant at breast and maintain latch.  LATCH Score: 7  Interventions Interventions: Breast feeding basics reviewed;Breast compression;Adjust position;Assisted with latch;Skin to skin;Support pillows;Breast massage;Position options;Hand express;Expressed milk;Pre-pump if needed;Hand pump;DEBP  Lactation Tools Discussed/Used Tools: Pump Breast pump type: Double-Electric Breast Pump;Manual WIC Program: Yes Pump Review: Setup, frequency, and cleaning;Milk Storage Initiated by:: DEBP by RN and hand pump given by Angela Ashley Date initiated:: 04/29/20   Consult Status Consult Status: Follow-up Date: 04/30/20 Follow-up type: In-patient    Angela Ashley 04/29/2020, 9:53 PM

## 2020-04-29 NOTE — Anesthesia Preprocedure Evaluation (Signed)
Anesthesia Evaluation  Patient identified by MRN, date of birth, ID band Patient awake    Reviewed: Allergy & Precautions, H&P , NPO status , Patient's Chart, lab work & pertinent test results  History of Anesthesia Complications (+) history of anesthetic complications  Airway Mallampati: III  TM Distance: >3 FB Neck ROM: full    Dental no notable dental hx. (+) Teeth Intact   Pulmonary asthma , former smoker,    Pulmonary exam normal breath sounds clear to auscultation       Cardiovascular negative cardio ROS   Rhythm:regular Rate:Normal     Neuro/Psych negative neurological ROS  negative psych ROS   GI/Hepatic negative GI ROS, Neg liver ROS,   Endo/Other  negative endocrine ROS  Renal/GU negative Renal ROS  negative genitourinary   Musculoskeletal negative musculoskeletal ROS (+)   Abdominal Normal abdominal exam  (+)   Peds negative pediatric ROS (+)  Hematology negative hematology ROS (+)   Anesthesia Other Findings   Reproductive/Obstetrics negative OB ROS (+) Pregnancy SROM 39 weeks  Active labor Active Herpes                             Anesthesia Physical  Anesthesia Plan  ASA: II  Anesthesia Plan: Spinal   Post-op Pain Management:    Induction:   PONV Risk Score and Plan: 2 and Treatment may vary due to age or medical condition  Airway Management Planned: Natural Airway  Additional Equipment:   Intra-op Plan:   Post-operative Plan:   Informed Consent: I have reviewed the patients History and Physical, chart, labs and discussed the procedure including the risks, benefits and alternatives for the proposed anesthesia with the patient or authorized representative who has indicated his/her understanding and acceptance.       Plan Discussed with: Anesthesiologist, CRNA and Surgeon  Anesthesia Plan Comments:         Anesthesia Quick Evaluation

## 2020-04-29 NOTE — Brief Op Note (Signed)
04/29/2020  1:13 PM  PATIENT:  Angela Ashley  34 y.o. female  PRE-OPERATIVE DIAGNOSIS:  Prior cesarean Section, desires permanent sterilization  POST-OPERATIVE DIAGNOSIS:  Prior cesarean Section, desires permanent sterilization  PROCEDURE:  Procedure(s): CESAREAN SECTION WITH BILATERAL TUBAL LIGATION (Bilateral)  SURGEON:  Surgeon(s) and Role:    * Hoover Browns, MD - Primary  ASSISTANTS: [place], CNM   ANESTHESIA:   spinal  EBL:  535 ml  BLOOD ADMINISTERED:none  DRAINS: none   LOCAL MEDICATIONS USED:  NONE  SPECIMEN:  Source of Specimen:  Placenta to Labor and Delivery.  Left and right fallopian tubes to pathology.    DISPOSITION OF SPECIMEN:  As above.   COUNTS:  YES  TOURNIQUET:  * No tourniquets in log *  DICTATION: .Note written in EPIC  PLAN OF CARE: Admit to inpatient   PATIENT DISPOSITION:  PACU - hemodynamically stable.   Delay start of Pharmacological VTE agent (>24hrs) due to surgical blood loss or risk of bleeding: not applicable  04/29/2020. 1445.

## 2020-04-30 DIAGNOSIS — D509 Iron deficiency anemia, unspecified: Secondary | ICD-10-CM | POA: Diagnosis present

## 2020-04-30 LAB — CBC
HCT: 26.4 % — ABNORMAL LOW (ref 36.0–46.0)
Hemoglobin: 8.1 g/dL — ABNORMAL LOW (ref 12.0–15.0)
MCH: 22.1 pg — ABNORMAL LOW (ref 26.0–34.0)
MCHC: 30.7 g/dL (ref 30.0–36.0)
MCV: 71.9 fL — ABNORMAL LOW (ref 80.0–100.0)
Platelets: 229 10*3/uL (ref 150–400)
RBC: 3.67 MIL/uL — ABNORMAL LOW (ref 3.87–5.11)
RDW: 21.2 % — ABNORMAL HIGH (ref 11.5–15.5)
WBC: 25.1 10*3/uL — ABNORMAL HIGH (ref 4.0–10.5)
nRBC: 0.1 % (ref 0.0–0.2)

## 2020-04-30 MED ORDER — SODIUM CHLORIDE 0.9 % IV SOLN
INTRAVENOUS | Status: DC | PRN
  Administered 2020-04-30: 250 mL via INTRAVENOUS

## 2020-04-30 MED ORDER — IBUPROFEN 800 MG PO TABS
800.0000 mg | ORAL_TABLET | Freq: Three times a day (TID) | ORAL | Status: DC
  Administered 2020-04-30 – 2020-05-01 (×3): 800 mg via ORAL
  Filled 2020-04-30 (×3): qty 1

## 2020-04-30 MED ORDER — SODIUM CHLORIDE 0.9 % IV SOLN
510.0000 mg | Freq: Once | INTRAVENOUS | Status: AC
  Administered 2020-04-30: 510 mg via INTRAVENOUS
  Filled 2020-04-30: qty 17

## 2020-04-30 NOTE — Progress Notes (Addendum)
Subjective: POD# 1 Information for the patient's newborn:  Angela Ashley, Angela Ashley [[id number]]  female    Reports feeling well, showered and dressed w/o assistance. Denies fatigue from IDA, but agrees to iron infusion.  Feeding: breast Reports tolerating PO and denies N/V, foley removed, ambulating and urinating w/o difficulty  Pain controlled with PO meds Denies HA/SOB/dizziness  Flatus passing, but reports gas pain in right shoulder Vaginal bleeding is normal, no clots     Objective:  VS:  Vitals:   04/29/20 2240 04/30/20 0040 04/30/20 0241 04/30/20 0535  BP: 104/69  109/77 103/78  Pulse: 85 84 89 96  Resp: 20 20 20 20   Temp:   98.5 F (36.9 C)   TempSrc:   Oral   SpO2: 100% 100% 100% 100%  Weight:      Height:        Intake/Output Summary (Last 24 hours) at 04/30/2020 0750 Last data filed at 04/30/2020 0500 Gross per 24 hour  Intake 3345.8 ml  Output 1543 ml  Net 1802.8 ml     Recent Labs    04/27/20 0757 04/30/20 0553  WBC 9.8 25.1*  HGB 9.1* 8.1*  HCT 30.2* 26.4*  PLT 213 229    Blood type: --/--/O POS, O POS Performed at [facility], [address], [place], [place]  [phone] (84/69) Rubella: Immune (11/02 0000)    Physical Exam:  General: alert, cooperative and no distress CV: Regular rate and rhythm Resp: clear Abdomen: soft, nontender, normal bowel sounds Incision: dry, intact and serous drainage present on 25% of dressing, change prior to discharge Perineum:  Uterine Fundus: firm, below umbilicus, nontender Lochia: minimal and no clots Ext: extremities normal, atraumatic, no cyanosis or edema and no edema, redness or tenderness in the calves or thighs   Assessment/Plan: 34 y.o.   POD# 1  32                  Active Problems:   Postpartum care following cesarean delivery   Encounter for maternal care for low transverse scar from previous cesarean delivery   Encounter for maternal care for scar from repeat cesarean delivery  Status post repeat low transverse cesarean section   S/P tubal ligation   Anemia, iron deficiency    -asymptomatic  Routine post-op PP care     Feraheme infusion today    Advance diet as tolerated Advised warm fluids and ambulation to improve GI motility Encourage rest when baby rests Breastfeeding support   [id number], MSN, CNM 04/30/2020, 7:50 AM   Attestation of Attending Supervision of Advanced Practitioner (CNM/NP) and MD Addendum: Evaluation and management procedures were performed by the Advanced Practitioner under my supervision and collaboration.  I have reviewed the Advanced Practitioner's note and chart, and I agree with the management and plan. I saw and examined patient at bedside and agree with above findings, assessment and plan as outlined above by CNM 05/02/2020. Patient complaining of right shoulder pain and some pain in RUQ of abdomen when she takes a deep breath.  Physical exam was significant for moderately distended abdomen with good bowel sounds. Heart and lungs exams were normal. Abdomen was tender to palpation and with some superficial edema, but no rebound, no guarding.  I advised patient to continue with ambulation and simethicone usage to help with gas passage.  We discussed the shoulder pain and right upper abdominal pain could also be from some post-op blood irritation of the diaphragm.  Will  continue to monitor these symptoms.  She has received IV iron infusion today and plan is for another infusion in 3 to 7 days, will arrange for infusion in [facility].  She would like oxycodone-acetaminophen tablet now.   -I discussed with patient risks, benefits and alternatives of circumcision including risks of bleeding, infection, damage to organs and need for further procedures.  All her questions were answered and she consented for the procedure to be done on her son. Dr. Alesia Richards. 04/30/2020.

## 2020-04-30 NOTE — Lactation Note (Addendum)
This note was copied from a baby's chart. Lactation Consultation Note  Patient Name: Angela Ashley [id number] Date: 04/30/2020   Baby Angela Cavan now 64 hours old.  Mom reports she is ready to get her DEBP for home use since she is a [employer] employee.  Mom does not have her insurance card with her.  Mom became very upset when I told her she had t have her physical card to make a copy and said "well nobody told me that".  Mom has [employer] pump form already in room. Mom Reports he is latching and breastfeeding but when she goes home will be completely botlte feeding.Mom denies need for lactation services at this time.Mom reports she is working on getoing her iron level up. LC verified moms [employer] insurance in the computer and went ahead and issued Medela Freestlye flex pump to mom.  Urged her to continue to use the hospital pump while at there hospital and use her pump at home.  Urged mom to call lactation as needed.  Maternal Data    Feeding    LATCH Score                   Interventions    Lactation Tools Discussed/Used     Consult Status      [name] Michaelle Copas 04/30/2020, 6:57 PM

## 2020-05-01 MED ORDER — OXYCODONE HCL 5 MG PO TABS: 5.0000 mg | ORAL_TABLET | Freq: Three times a day (TID) | ORAL | 0 refills | Status: AC | PRN

## 2020-05-01 MED ORDER — IBUPROFEN 800 MG PO TABS: 800.0000 mg | ORAL_TABLET | Freq: Three times a day (TID) | ORAL | 0 refills | Status: DC

## 2020-05-01 MED ORDER — SENNOSIDES-DOCUSATE SODIUM 8.6-50 MG PO TABS: 2.0000 | ORAL_TABLET | ORAL | 0 refills | Status: DC

## 2020-05-01 MED ORDER — SIMETHICONE 80 MG PO CHEW: 80.0000 mg | CHEWABLE_TABLET | Freq: Three times a day (TID) | ORAL | 0 refills | Status: DC

## 2020-05-01 NOTE — Discharge Summary (Signed)
Repeat CS with BTL OB Discharge Summary     Patient Name: Angela Ashley DOB: [birth year] MRN: [id number]  Date of admission: 04/29/2020 Delivering MD: Hoover Browns  Date of delivery: 04/29/2020 Type of delivery: RCS with BTL  Newborn Data: Sex: Baby female Circumcision: completed on 5/31 Live born female  Birth Weight: 6 lb 13.2 oz (3095 g) APGAR: 9, 9  Newborn Delivery   Birth date/time: 04/29/2020 11:38:00 Delivery type: C-Section, Low Transverse Trial of labor: No C-section categorization: Repeat      Feeding: breast and bottle Infant being discharge to home with mother in stable condition.   Admitting diagnosis: Encounter for maternal care for low transverse scar from previous cesarean delivery [O34.211] Encounter for maternal care for scar from repeat cesarean delivery [O34.219] Intrauterine pregnancy: [redacted]w[redacted]d     Secondary diagnosis:  Active Problems:   Encounter for maternal care for low transverse scar from previous cesarean delivery   Encounter for maternal care for scar from repeat cesarean delivery   Status post repeat low transverse cesarean section   S/P tubal ligation   Postpartum care following cesarean delivery   Anemia, iron deficiency                                Complications: None                                                              Intrapartum Procedures: cesarean: low cervical, transverse Postpartum Procedures: none Complications-Operative and Postpartum: none Augmentation: AROM at delivery    History of Present Illness: Angela Ashley is a 34 y.o. female, G3P3003, who presents at [redacted]w[redacted]d weeks gestation. The patient has been followed at  [facility]  Her pregnancy has been complicated by:  Patient Active Problem List   Diagnosis Date Noted   Postpartum care following cesarean delivery 04/30/2020   Anemia, iron deficiency 04/30/2020   Encounter for maternal care for low transverse scar from  previous cesarean delivery 04/29/2020   Encounter for maternal care for scar from repeat cesarean delivery 04/29/2020   Status post repeat low transverse cesarean section 04/29/2020   S/P tubal ligation 04/29/2020   Morning sickness 09/26/2019   Intrauterine pregnancy 09/26/2019   Subchorionic hemorrhage 09/26/2019   Bacterial vaginosis 06/20/2015    Hospital course:  Sceduled C/S   34 y.o. yo G3P3003 at [redacted]w[redacted]d was admitted to the hospital 04/29/2020 for scheduled cesarean section with the following indication:Elective Repeat.Delivery details are as follows:  Membrane Rupture Time/Date: 11:37 AM ,04/29/2020   Delivery Method:C-Section, Low Transverse  Details of operation can be found in separate operative note.  Patient had an uncomplicated postpartum course.  She is ambulating, tolerating a regular diet, passing flatus, and urinating well. Patient is discharged home in stable condition on  05/01/20        Newborn Data: Birth date:04/29/2020  Birth time:11:38 AM  Gender:Female  Living status:Living  Apgars:9 ,9  Weight:3095 g     Hospital Course--Scheduled Cesarean: Patient was admitted on 04/29/2020 for a scheduled repeat cesarean delivery.   She was taken to the operating room, where Dr. Sallye Ober performed a repeat LTCS under spinal anesthesia, with delivery of a viable baby female,  with weight and Apgars as listed below. Infant was in good condition and remained at the patient's bedside.  The patient was taken to recovery in good condition.  Patient planned to breast and bottle feed.  On post-op day 1, patient was doing well, tolerating a regular diet, with Hgb of 9.1-8.1, received IV iron on 5/31, tolerated well, had anemia in pregnancy.  Throughout her stay, her physical exam was WNL, her incision was CDI, and her vital signs remained stable.  By post-op day 1, she was up ad lib, tolerating a regular diet, with good pain control with po med.  She was deemed to have received the full benefit  of her hospital stay, and was discharged home in stable condition.  Contraceptive choice was BTL during surgery.    Physical exam  Vitals:   04/30/20 1543 04/30/20 2143 05/01/20 0540 05/01/20 1324  BP: 100/63 113/82 111/83 120/81  Pulse: 96 (!) 113 96 (!) 104  Resp: 20 18 18 18   Temp: 98 F (36.7 C) 98.8 F (37.1 C) 97.9 F (36.6 C) 98.2 F (36.8 C)  TempSrc: Oral Oral Oral Oral  SpO2: 100% 95%    Weight:      Height:       General: alert, cooperative and no distress Lochia: appropriate Uterine Fundus: firm Incision: Healing well with no significant drainage, No significant erythema, Dressing is clean, dry, and intact, honeycomb dressing CDI Perineum: Intact DVT Evaluation: No evidence of DVT seen on physical exam. Negative Homan's sign. No cords or calf tenderness. No significant calf/ankle edema.  Labs: Lab Results  Component Value Date   WBC 25.1 (H) 04/30/2020   HGB 8.1 (L) 04/30/2020   HCT 26.4 (L) 04/30/2020   MCV 71.9 (L) 04/30/2020   PLT 229 04/30/2020   CMP Latest Ref Rng & Units 02/11/2017  Glucose 65 - 99 mg/dL 69  BUN 6 - 20 mg/dL 8  Creatinine 0.57 - 1.00 mg/dL 0.79  Sodium 134 - 144 mmol/L 140  Potassium 3.5 - 5.2 mmol/L 4.4  Chloride 96 - 106 mmol/L 100  CO2 18 - 29 mmol/L 21  Calcium 8.7 - 10.2 mg/dL 9.9  Total Protein 6.0 - 8.5 g/dL 7.9  Total Bilirubin 0.0 - 1.2 mg/dL 0.6  Alkaline Phos 39 - 117 IU/L 65  AST 0 - 40 IU/L 17  ALT 0 - 32 IU/L 10    Date of discharge: 05/01/2020 Discharge Diagnoses: Term Pregnancy-delivered Discharge instruction: per After Visit Summary and "Baby and Me Booklet".  After visit meds:   Activity:           unrestricted and pelvic rest Advance as tolerated. Pelvic rest for 6 weeks.  Diet:                routine Medications: PNV, Ibuprofen, Colace, Iron and oxy IR, simethicone, senna.  Postpartum contraception: Undecided Condition:  Pt discharge to home with baby in stable condition  Meds: Allergies as of  05/01/2020   No Known Allergies     Medication List    TAKE these medications   ibuprofen 800 MG tablet Commonly known as: ADVIL Take 1 tablet (800 mg total) by mouth every 8 (eight) hours.   oxyCODONE 5 MG immediate release tablet Commonly known as: Roxicodone Take 1 tablet (5 mg total) by mouth every 8 (eight) hours as needed.   prenatal vitamin w/FE, FA 27-1 MG Tabs tablet Take 1 tablet by mouth at bedtime.   senna-docusate 8.6-50 MG tablet Commonly known as:  Senokot-S Take 2 tablets by mouth daily. Start taking on: May 02, 2020   simethicone 80 MG chewable tablet Commonly known as: MYLICON Chew 1 tablet (80 mg total) by mouth 3 (three) times daily after meals.            Discharge Care Instructions  (From admission, onward)         Start     Ordered   05/01/20 0000  Discharge wound care:    Comments: Take dressing off on day 5-7 postpartum.  Report increased drainage, redness or warmth. Clean with water, let soap trickle down body. Can leave steri strips on until they fall off or take them off gently at day 10. Keep open to air, clean and dry.   05/01/20 1756          Discharge Follow Up:  Follow-up Information    [facility]. Schedule an appointment as soon as possible for a visit in 6 week(s).   Specialty: Obstetrics and Gynecology Contact information: [address] [place] [address] [phone]           [place], NP-C, CNM 05/01/2020, 6:18 PM  [place], [place]

## 2020-05-02 LAB — SURGICAL PATHOLOGY

## 2020-05-03 ENCOUNTER — Other Ambulatory Visit: Payer: Self-pay | Admitting: Hematology

## 2020-05-03 ENCOUNTER — Telehealth: Payer: Self-pay | Admitting: Hematology

## 2020-05-03 DIAGNOSIS — D508 Other iron deficiency anemias: Secondary | ICD-10-CM

## 2020-05-03 NOTE — Telephone Encounter (Signed)
Scheduled per 6/3 sch message. Unable to reach pt. Voicemail is full. Mailing calendar to pt.

## 2020-05-04 ENCOUNTER — Telehealth: Payer: Self-pay

## 2020-05-04 NOTE — Telephone Encounter (Signed)
Per Obgyn referral received, TCT patient to inform her that she would need to get labs for cbc/diff, cmp, ferritin, iron profile and B12 as soon as she wants to come in; and then for phone visit day after labs and IV Injectafer weekly x2 after phone visit. Pt. verbally expressed no interest in doing any labs or phone visit, she stated she already had one iron infusion in the hospital and was supposed to get a second one, anything outside just getting the second iron infusion she stated "not interested"  Per Dr. Candise Che; advised pt. That she would need to call her Obgyn doctor to set up the second IV Iron infusion as Dr. Candise Che cannot do it without a discussion of risk/benefits.  Pt. Verbalized understanding promising to speak with her Obgyn doctor.

## 2020-05-06 ENCOUNTER — Inpatient Hospital Stay (HOSPITAL_COMMUNITY): Admit: 2020-05-06 | Payer: 59

## 2020-06-11 DIAGNOSIS — N898 Other specified noninflammatory disorders of vagina: Secondary | ICD-10-CM | POA: Diagnosis not present

## 2020-06-11 DIAGNOSIS — Z113 Encounter for screening for infections with a predominantly sexual mode of transmission: Secondary | ICD-10-CM | POA: Diagnosis not present

## 2020-06-11 DIAGNOSIS — Z3040 Encounter for surveillance of contraceptives, unspecified: Secondary | ICD-10-CM | POA: Diagnosis not present

## 2020-06-11 DIAGNOSIS — F419 Anxiety disorder, unspecified: Secondary | ICD-10-CM | POA: Diagnosis not present

## 2020-06-11 DIAGNOSIS — N760 Acute vaginitis: Secondary | ICD-10-CM | POA: Diagnosis not present

## 2020-06-11 DIAGNOSIS — Z862 Personal history of diseases of the blood and blood-forming organs and certain disorders involving the immune mechanism: Secondary | ICD-10-CM | POA: Diagnosis not present

## 2020-07-03 ENCOUNTER — Inpatient Hospital Stay: Payer: 59

## 2020-07-03 ENCOUNTER — Inpatient Hospital Stay: Payer: 59 | Attending: Hematology | Admitting: Hematology

## 2020-07-30 DIAGNOSIS — F419 Anxiety disorder, unspecified: Secondary | ICD-10-CM | POA: Diagnosis not present

## 2021-06-17 ENCOUNTER — Other Ambulatory Visit (HOSPITAL_COMMUNITY): Payer: Self-pay

## 2021-06-17 MED ORDER — CARESTART COVID-19 HOME TEST VI KIT
PACK | 0 refills | Status: DC
  Filled 2021-06-17: qty 4, 4d supply, fill #0

## 2021-06-24 DIAGNOSIS — Z20822 Contact with and (suspected) exposure to covid-19: Secondary | ICD-10-CM | POA: Diagnosis not present

## 2021-07-31 ENCOUNTER — Telehealth: Payer: Self-pay

## 2021-07-31 NOTE — Telephone Encounter (Signed)
Patient called wanting to schedule virtual visit for this afternoon or tomorrow for coughing spells pt took Covid test with negative results pt said it is ok to leave detailed message on voicemail

## 2021-08-01 ENCOUNTER — Telehealth (INDEPENDENT_AMBULATORY_CARE_PROVIDER_SITE_OTHER): Payer: 59 | Admitting: Family Medicine

## 2021-08-01 ENCOUNTER — Encounter: Payer: Self-pay | Admitting: Family Medicine

## 2021-08-01 VITALS — Wt 139.0 lb

## 2021-08-01 DIAGNOSIS — R059 Cough, unspecified: Secondary | ICD-10-CM | POA: Diagnosis not present

## 2021-08-01 MED ORDER — DOXYCYCLINE HYCLATE 100 MG PO TABS
100.0000 mg | ORAL_TABLET | Freq: Two times a day (BID) | ORAL | 0 refills | Status: DC
Start: 2021-08-01 — End: 2022-02-20

## 2021-08-01 MED ORDER — ALBUTEROL SULFATE HFA 108 (90 BASE) MCG/ACT IN AERS
2.0000 | INHALATION_SPRAY | Freq: Four times a day (QID) | RESPIRATORY_TRACT | 0 refills | Status: DC | PRN
Start: 2021-08-01 — End: 2023-04-09

## 2021-08-01 MED ORDER — BENZONATATE 100 MG PO CAPS
100.0000 mg | ORAL_CAPSULE | Freq: Three times a day (TID) | ORAL | 0 refills | Status: DC | PRN
Start: 2021-08-01 — End: 2022-02-20

## 2021-08-01 NOTE — Telephone Encounter (Signed)
PT called in regards to this msg from yesterday still waiting on a callback if it is okay for them schedule a virtual visit.

## 2021-08-01 NOTE — Progress Notes (Signed)
Virtual Visit via Video Note  I connected with Angela Ashley  on 08/01/21 at  3:40 PM EDT by a video enabled telemedicine application and verified that I am speaking with the correct person using two identifiers.  Location patient: car, [place] Location provider:work or home office Persons participating in the virtual visit: patient, provider  I discussed the limitations of evaluation and management by telemedicine and the availability of in person appointments. The patient expressed understanding and agreed to proceed.   HPI:  Acute telemedicine visit for Cough: -Onset: 2 weeks -Symptoms include: started out like a cold, but has progress, thick congestion, cough -had negative covid testing -zyrtec seemed to help her sleep some -Denies: fevers, CP, SOB, NVD, inability to eat/drink/get out of bed -Pertinent past medical history: denies any asthma in years since moving to [facility] -Pertinent medication allergies:No Known Allergies -COVID-19 vaccine status: x2 plus a booster -denies any chance of pregnancy   ROS: See pertinent positives and negatives per HPI.  Past Medical History:  Diagnosis Date   Abnormal Pap smear    Asthma    no inhaler   Bacterial vaginosis    reoccuring   Chlamydia    Herpes    Sickle cell trait [facility])     Past Surgical History:  Procedure Laterality Date   CESAREAN SECTION N/A 03/26/2013   Procedure: Primary CESAREAN SECTION of baby girl  at  2051  APGAR 9/9;  Surgeon: Melina Schools, MD;  Location: [facility];  Service: Obstetrics;  Laterality: N/A;   CESAREAN SECTION WITH BILATERAL TUBAL LIGATION Bilateral 04/29/2020   Procedure: CESAREAN SECTION WITH BILATERAL TUBAL LIGATION;  Surgeon: Waymon Amato, MD;  Location: [facility];  Service: Obstetrics;  Laterality: Bilateral;   COLPOSCOPY W/ BIOPSY / CURETTAGE     Dr. in [place]     Current Outpatient Medications:    albuterol (VENTOLIN HFA) 108 (90 Base) MCG/ACT inhaler, Inhale 2 puffs into the lungs every 6 (six)  hours as needed for wheezing or shortness of breath., Disp: 8 g, Rfl: 0   benzonatate (TESSALON PERLES) 100 MG capsule, Take 1 capsule (100 mg total) by mouth 3 (three) times daily as needed., Disp: 20 capsule, Rfl: 0   COVID-19 At Home Antigen Test (CARESTART COVID-19 HOME TEST) KIT, USE AS DIRECTED WITHIN PACKAGE INSTRUCTIONS., Disp: 4 each, Rfl: 0   doxycycline (VIBRA-TABS) 100 MG tablet, Take 1 tablet (100 mg total) by mouth 2 (two) times daily., Disp: 14 tablet, Rfl: 0   ibuprofen (ADVIL) 800 MG tablet, Take 1 tablet (800 mg total) by mouth every 8 (eight) hours., Disp: 30 tablet, Rfl: 0  EXAM:  VITALS per patient if applicable:  GENERAL: alert, oriented, appears well and in no acute distress  HEENT: atraumatic, conjunttiva clear, no obvious abnormalities on inspection of external nose and ears  NECK: normal movements of the head and neck  LUNGS: on inspection no signs of respiratory distress, breathing rate appears normal, no obvious gross SOB, gasping or wheezing  CV: no obvious cyanosis  MS: moves all visible extremities without noticeable abnormality  PSYCH/NEURO: pleasant and cooperative, no obvious depression or anxiety, speech and thought processing grossly intact  ASSESSMENT AND PLAN:  Discussed the following assessment and plan:  Cough  -we discussed possible serious and likely etiologies, options for evaluation and workup, limitations of telemedicine visit vs in person visit, treatment, treatment risks and precautions. Pt is agreeable to treatment via telemedicine at this moment. Query allergies, possible bronchitis/asthma component, Viral vs developing sinusitis  or other. Patient has opted for tessalon for cough, alb q 6 hours if needed and abx if worsening or not improving - doxy.   Advised to seek prompt in person care if worsening, new symptoms arise, or if is not improving with treatment. Discussed options for inperson care if PCP office not available. Did let this  patient know that I only do telemedicine on Tuesdays and Thursdays for [place]. Advised to schedule follow up visit with PCP or [facility] if any further questions or concerns to avoid delays in care.   I discussed the assessment and treatment plan with the patient. The patient was provided an opportunity to ask questions and all were answered. The patient agreed with the plan and demonstrated an understanding of the instructions.     Lucretia Kern, DO

## 2021-08-01 NOTE — Patient Instructions (Signed)
-I sent the medication(s) we discussed to your pharmacy: Meds ordered this encounter  Medications   benzonatate (TESSALON PERLES) 100 MG capsule    Sig: Take 1 capsule (100 mg total) by mouth 3 (three) times daily as needed.    Dispense:  20 capsule    Refill:  0   albuterol (VENTOLIN HFA) 108 (90 Base) MCG/ACT inhaler    Sig: Inhale 2 puffs into the lungs every 6 (six) hours as needed for wheezing or shortness of breath.    Dispense:  8 g    Refill:  0   doxycycline (VIBRA-TABS) 100 MG tablet    Sig: Take 1 tablet (100 mg total) by mouth 2 (two) times daily.    Dispense:  14 tablet    Refill:  0   Zyrtec daily  I hope you are feeling better soon!  Seek in person care promptly if your symptoms worsen, new concerns arise or you are not improving with treatment.  It was nice to meet you today. I help [facility] out with telemedicine visits on Tuesdays and Thursdays and am available for visits on those days. If you have any concerns or questions following this visit please schedule a follow up visit with your Primary Care doctor or seek care at a local urgent care clinic to avoid delays in care.   Doxycycline Capsules or Tablets What is this medication? DOXYCYCLINE (dox i SYE kleen) treats infections caused by bacteria. It belongs to a group of medications called tetracycline antibiotics. It will not treat colds, the flu, or infections caused by viruses. This medicine may be used for other purposes; ask your health care provider or pharmacist if you have questions. COMMON BRAND NAME(S): Acticlate, Adoxa, Adoxa CK, Adoxa Pak, Adoxa TT, Alodox, Avidoxy, Doxal, LYMEPAK, Mondoxyne NL, Monodox, Morgidox 1x, Morgidox 1x Kit, Morgidox 2x, Morgidox 2x Kit, NutriDox, Ocudox, [place], [place], [place], Vibra-Tabs, Vibramycin What should I tell my care team before I take this medication? They need to know if you have any of these conditions: Kidney disease Liver disease Long exposure to sunlight  like working outdoors Recent stomach surgery Stomach or intestine problems such as colitis Vision Problems Yeast or fungal infection of the mouth or vagina An unusual or allergic reaction to doxycycline, tetracycline antibiotics, other medications, foods, dyes, or preservatives Pregnant or trying to get pregnant Breast-feeding How should I use this medication? Take this medication by mouth with water. Take it as directed on the prescription label at the same time every day. It is best to take this medication without food, but if it upsets your stomach take it with food. Take all of this medication unless your care team tells you to stop it early. Keep taking it even if you think you are better. Take antacids and products with aluminum, calcium, magnesium, iron, and zinc in them at a different time of day than this medication. Talk to your care team if you have questions. Talk to your care team regarding the use of this medication in children. While this medication may be prescribed for selected conditions, precautions do apply. Overdosage: If you think you have taken too much of this medicine contact a poison control center or emergency room at once. NOTE: This medicine is only for you. Do not share this medicine with others. What if I miss a dose? If you miss a dose, take it as soon as you can. If it is almost time for your next dose, take only that dose. Do not take  double or extra doses. What may interact with this medication? Antacids, vitamins, or other products that contain aluminum, calcium, iron, magnesium, or zinc Barbiturates Birth control pills Bismuth subsalicylate Carbamazepine Methoxyflurane Oral retinoids such as acitretin, isotretinoin Other antibiotics Phenytoin Warfarin This list may not describe all possible interactions. Give your health care provider a list of all the medicines, herbs, non-prescription drugs, or dietary supplements you use. Also tell them if you smoke,  drink alcohol, or use illegal drugs. Some items may interact with your medicine. What should I watch for while using this medication? Tell your care team if your symptoms do not improve. Do not treat diarrhea with over the counter products. Contact your care team if you have diarrhea that lasts more than 2 days or if it is severe and watery. Do not take this medication just before going to bed. It may not dissolve properly when you lay down and can cause pain in your throat. Drink plenty of fluids while taking this medication to also help reduce irritation in your throat. This medication can make you more sensitive to the sun. Keep out of the sun. If you cannot avoid being in the sun, wear protective clothing and use sunscreen. Do not use sun lamps or tanning beds/booths. Birth control pills may not work properly while you are taking this medication. Talk to your care team about using an extra method of birth control. If you are being treated for a sexually transmitted infection, avoid sexual contact until you have finished your treatment. Your sexual partner may also need treatment. If you are using this medication to prevent malaria, you should still protect yourself from contact with mosquitos. Stay in screened-in areas, use mosquito nets, keep your body covered, and use an insect repellent. What side effects may I notice from receiving this medication? Side effects that you should report to your care team as soon as possible: Allergic reactions-skin rash, itching, hives, swelling of the face, lips, tongue, or throat Increased pressure around the brain-severe headache, change in vision, blurry vision, nausea, vomiting Joint pain Pain or trouble swallowing Redness, blistering, peeling, or loosening of the skin, including inside the mouth Severe diarrhea, fever Unusual vaginal discharge, itching, or odor Side effects that usually do not require medical attention (report these to your care team if  they continue or are bothersome): Change in tooth color Diarrhea Headache Heartburn Nausea This list may not describe all possible side effects. Call your doctor for medical advice about side effects. You may report side effects to FDA at 1-800-FDA-1088. Where should I keep my medication? Keep out of the reach of children and pets. Store at room temperature, below 30 degrees C (86 degrees F). Protect from light. Keep container tightly closed. Throw away any unused medication after the expiration date. Taking this medication after the expiration date can make you seriously ill. NOTE: This sheet is a summary. It may not cover all possible information. If you have questions about this medicine, talk to your doctor, pharmacist, or health care provider.  2022 Elsevier/Gold Standard (2021-01-17 14:12:28)

## 2021-08-01 NOTE — Telephone Encounter (Signed)
Not sure what message  . I dont know this patient   I dont have appts until next week .  Please see if her pcp  team ? Is it dr Selena Batten?can see her today or tomorrow if needed   I only saw her once 2 years ago .

## 2021-08-01 NOTE — Telephone Encounter (Signed)
Pt has been scheduled with Dr. Selena Batten today at 3:40.

## 2021-08-22 ENCOUNTER — Other Ambulatory Visit (HOSPITAL_COMMUNITY): Payer: Self-pay

## 2021-08-22 MED ORDER — CARESTART COVID-19 HOME TEST VI KIT
PACK | 0 refills | Status: DC
  Filled 2021-08-22: qty 4, 4d supply, fill #0

## 2021-08-30 ENCOUNTER — Other Ambulatory Visit (HOSPITAL_COMMUNITY): Payer: Self-pay

## 2021-11-28 DIAGNOSIS — Z113 Encounter for screening for infections with a predominantly sexual mode of transmission: Secondary | ICD-10-CM | POA: Diagnosis not present

## 2021-11-28 DIAGNOSIS — Z01419 Encounter for gynecological examination (general) (routine) without abnormal findings: Secondary | ICD-10-CM | POA: Diagnosis not present

## 2021-12-05 LAB — HM PAP SMEAR

## 2022-02-07 DIAGNOSIS — F411 Generalized anxiety disorder: Secondary | ICD-10-CM | POA: Diagnosis not present

## 2022-02-18 ENCOUNTER — Ambulatory Visit: Payer: 59 | Admitting: Internal Medicine

## 2022-02-20 ENCOUNTER — Encounter: Payer: Self-pay | Admitting: Internal Medicine

## 2022-02-20 ENCOUNTER — Other Ambulatory Visit (HOSPITAL_COMMUNITY)
Admission: RE | Admit: 2022-02-20 | Discharge: 2022-02-20 | Disposition: A | Discharge status: Home / Self Care | Payer: 59 | Source: Ambulatory Visit | Attending: Internal Medicine | Admitting: Internal Medicine

## 2022-02-20 ENCOUNTER — Ambulatory Visit: Payer: 59 | Admitting: Internal Medicine

## 2022-02-20 ENCOUNTER — Telehealth: Payer: Self-pay | Admitting: Internal Medicine

## 2022-02-20 VITALS — BP 110/70 | HR 105 | Temp 98.5°F | Ht 60.0 in | Wt 139.1 lb

## 2022-02-20 DIAGNOSIS — F411 Generalized anxiety disorder: Secondary | ICD-10-CM | POA: Diagnosis not present

## 2022-02-20 DIAGNOSIS — R5383 Other fatigue: Secondary | ICD-10-CM | POA: Diagnosis not present

## 2022-02-20 DIAGNOSIS — N898 Other specified noninflammatory disorders of vagina: Secondary | ICD-10-CM | POA: Insufficient documentation

## 2022-02-20 MED ORDER — SERTRALINE HCL 50 MG PO TABS: 50.0000 mg | ORAL_TABLET | Freq: Every day | ORAL | 1 refills | Status: DC

## 2022-02-20 MED ORDER — FLUCONAZOLE 150 MG PO TABS: 150.0000 mg | ORAL_TABLET | Freq: Every day | ORAL | 0 refills | Status: AC

## 2022-02-20 NOTE — Addendum Note (Signed)
Addended by: Kern Reap B on: 02/20/2022 02:16 PM ? ? Modules accepted: Orders ? ?

## 2022-02-20 NOTE — Telephone Encounter (Signed)
Pt call and stated she don't want the sertraline because it make you gain weight she stated if you can call her something else in are give her LORazepam (ATIVAN) 0.5 MG tablet and up the dose. ?

## 2022-02-20 NOTE — Patient Instructions (Signed)
-  Nice seeing you today!! ? ?-Lab work today; will notify you once results are available. ? ?-Start sertraline 50 mg at bedtime. ? ?-Schedule follow up in 8 weeks. ?

## 2022-02-20 NOTE — Telephone Encounter (Signed)
Patient is aware 

## 2022-02-20 NOTE — Progress Notes (Addendum)
? ? ? ?Established Patient Office Visit ? ? ? ? ?This visit occurred during the SARS-CoV-2 public health emergency.  Safety protocols were in place, including screening questions prior to the visit, additional usage of staff PPE, and extensive cleaning of exam room while observing appropriate contact time as indicated for disinfecting solutions.  ? ? ?CC/Reason for Visit: Establish care, discuss chronic and acute concerns ? ?HPI: Angela Ashley is a 36 y.o. female who is coming in today for the above mentioned reasons. Past Medical History is significant for: Anxiety, not currently on medication.  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  She was a smoker who quit 2 years ago drinks alcohol occasionally, no known drug allergies, she has had 2 C-sections.  She had been prescribed lorazepam for her anxiety by her GYN.  She feels like it does not last long.  She has been experiencing vaginal itching.  She tells me that she has been empirically treated for yeast infection 3 times but issues recur. ? ? ?Past Medical/Surgical History: ?Past Medical History:  ?Diagnosis Date  ? Abnormal Pap smear   ? Asthma   ? no inhaler  ? Bacterial vaginosis   ? reoccuring  ? Chlamydia   ? Herpes   ? Sickle cell trait ([place])   ? ? ?Past Surgical History:  ?Procedure Laterality Date  ? CESAREAN SECTION N/A 03/26/2013  ? Procedure: Primary CESAREAN SECTION of baby girl  at  2051  APGAR 9/9;  Surgeon: Melina Schools, MD;  Location: [facility];  Service: Obstetrics;  Laterality: N/A;  ? CESAREAN SECTION WITH BILATERAL TUBAL LIGATION Bilateral 04/29/2020  ? Procedure: CESAREAN SECTION WITH BILATERAL TUBAL LIGATION;  Surgeon: Waymon Amato, MD;  Location: [facility];  Service: Obstetrics;  Laterality: Bilateral;  ? COLPOSCOPY W/ BIOPSY / CURETTAGE    ? Dr. in [place]  ? ? ?Social History: ? reports that she has quit smoking. Her smoking use included cigarettes. She has never used smokeless tobacco. She reports that she does not  currently use alcohol. She reports that she does not use drugs. ? ?Allergies: ?No Known Allergies ? ?Family History:  ?Family History  ?Problem Relation Age of Onset  ? Diabetes Paternal Grandmother   ? Healthy Mother   ? Healthy Father   ? Hypertension Maternal Grandmother   ? ? ? ?Current Outpatient Medications:  ?  albuterol (VENTOLIN HFA) 108 (90 Base) MCG/ACT inhaler, Inhale 2 puffs into the lungs every 6 (six) hours as needed for wheezing or shortness of breath., Disp: 8 g, Rfl: 0 ?  fluconazole (DIFLUCAN) 150 MG tablet, Take 1 tablet (150 mg total) by mouth daily for 3 doses., Disp: 3 tablet, Rfl: 0 ?  ibuprofen (ADVIL) 800 MG tablet, Take 1 tablet (800 mg total) by mouth every 8 (eight) hours., Disp: 30 tablet, Rfl: 0 ?  LORazepam (ATIVAN) 0.5 MG tablet, lorazepam 0.5 mg tablet  TAKE ONE OR TWO PILLS EVERY 6 HOURS AS NEEDED FOR ANXIETY/INSOMNIA, Disp: , Rfl:  ?  sertraline (ZOLOFT) 50 MG tablet, Take 1 tablet (50 mg total) by mouth daily., Disp: 90 tablet, Rfl: 1 ? ?Review of Systems:  ?Constitutional: Denies fever, chills, diaphoresis, appetite change and fatigue.  ?HEENT: Denies photophobia, eye pain, redness, hearing loss, ear pain, congestion, sore throat, rhinorrhea, sneezing, mouth sores, trouble swallowing, neck pain, neck stiffness and tinnitus.   ?Respiratory: Denies SOB, DOE, cough, chest tightness,  and wheezing.   ?Cardiovascular: Denies chest pain, palpitations and leg swelling.  ?  Gastrointestinal: Denies nausea, vomiting, abdominal pain, diarrhea, constipation, blood in stool and abdominal distention.  ?Genitourinary: Denies dysuria, urgency, frequency, hematuria, flank pain and difficulty urinating.  ?Endocrine: Denies: hot or cold intolerance, sweats, changes in hair or nails, polyuria, polydipsia. ?Musculoskeletal: Denies myalgias, back pain, joint swelling, arthralgias and gait problem.  ?Skin: Denies pallor, rash and wound.  ?Neurological: Denies dizziness, seizures, syncope, weakness,  light-headedness, numbness and headaches.  ?Hematological: Denies adenopathy. Easy bruising, personal or family bleeding history  ?Psychiatric/Behavioral: Denies suicidal ideation, mood changes, confusion, nervousness, sleep disturbance and agitation ? ? ? ?Physical Exam: ?Vitals:  ? 02/20/22 1341  ?BP: 110/70  ?Pulse: (!) 105  ?Temp: 98.5 ?F (36.9 ?C)  ?TempSrc: Oral  ?SpO2: 96%  ?Weight: 139 lb 1.6 oz (63.1 kg)  ?Height: 5' (1.524 m)  ? ? ?Body mass index is 27.17 kg/m?. ? ? ?Constitutional: NAD, calm, comfortable ?Eyes: PERRL, lids and conjunctivae normal ?ENMT: Mucous membranes are moist.  ?Respiratory: clear to auscultation bilaterally, no wheezing, no crackles. Normal respiratory effort. No accessory muscle use.  ?Cardiovascular: Regular rate and rhythm, no murmurs / rubs / gallops. No extremity edema.  ?Neurologic: Grossly intact and nonfocal ?Psychiatric: Normal judgment and insight. Alert and oriented x 3. Normal mood.  ? ? ?Impression and Plan: ? ?GAD (generalized anxiety disorder)  ?- Plan: sertraline (ZOLOFT) 50 MG tablet ? ?Vaginal itching ?-Wet prep today. ? ?Fatigue, unspecified type ? - Plan: CBC with Differential/Platelet, Comprehensive metabolic panel, TSH, Vitamin B12, VITAMIN D 25 Hydroxy (Vit-D Deficiency, Fractures) ? ?Time spent: 32 minutes reviewing chart, interviewing and examining patient and formulating plan of care. ? ? ?Patient Instructions  ?-Nice seeing you today!! ? ?-Lab work today; will notify you once results are available. ? ?-Start sertraline 50 mg at bedtime. ? ?-Schedule follow up in 8 weeks. ? ? ? ?Lelon Frohlich, MD ?[facility] ? ? ?

## 2022-02-20 NOTE — Addendum Note (Signed)
Addended by: Henderson Cloud on: 02/20/2022 02:17 PM ? ? Modules accepted: Orders ? ?

## 2022-02-21 LAB — COMPREHENSIVE METABOLIC PANEL
ALT: 12 U/L (ref 0–35)
AST: 20 U/L (ref 0–37)
Albumin: 4.5 g/dL (ref 3.5–5.2)
Alkaline Phosphatase: 54 U/L (ref 39–117)
BUN: 14 mg/dL (ref 6–23)
CO2: 26 mEq/L (ref 19–32)
Calcium: 9.8 mg/dL (ref 8.4–10.5)
Chloride: 105 mEq/L (ref 96–112)
Creatinine, Ser: 0.76 mg/dL (ref 0.40–1.20)
GFR: 102.55 mL/min (ref >60.00)
Glucose, Bld: 74 mg/dL (ref 70–99)
Potassium: 4 mEq/L (ref 3.5–5.1)
Sodium: 138 mEq/L (ref 135–145)
Total Bilirubin: 0.3 mg/dL (ref 0.2–1.2)
Total Protein: 7.8 g/dL (ref 6.0–8.3)

## 2022-02-21 LAB — CBC WITH DIFFERENTIAL/PLATELET
Basophils Absolute: 0.1 10*3/uL (ref 0.0–0.1)
Basophils Relative: 0.9 % (ref 0.0–3.0)
Eosinophils Absolute: 0.7 10*3/uL (ref 0.0–0.7)
Eosinophils Relative: 7.4 % — ABNORMAL HIGH (ref 0.0–5.0)
HCT: 32.6 % — ABNORMAL LOW (ref 36.0–46.0)
Hemoglobin: 10.4 g/dL — ABNORMAL LOW (ref 12.0–15.0)
Lymphocytes Relative: 29.7 % (ref 12.0–46.0)
Lymphs Abs: 2.8 10*3/uL (ref 0.7–4.0)
MCHC: 31.9 g/dL (ref 30.0–36.0)
MCV: 70.2 fl — ABNORMAL LOW (ref 78.0–100.0)
Monocytes Absolute: 0.6 10*3/uL (ref 0.1–1.0)
Monocytes Relative: 6.5 % (ref 3.0–12.0)
Neutro Abs: 5.1 10*3/uL (ref 1.4–7.7)
Neutrophils Relative %: 55.5 % (ref 43.0–77.0)
Platelets: 364 10*3/uL (ref 150.0–400.0)
RBC: 4.64 Mil/uL (ref 3.87–5.11)
RDW: 19.5 % — ABNORMAL HIGH (ref 11.5–15.5)
WBC: 9.3 10*3/uL (ref 4.0–10.5)

## 2022-02-24 LAB — CERVICOVAGINAL ANCILLARY ONLY
Bacterial Vaginitis (gardnerella): POSITIVE — AB
Candida Glabrata: NEGATIVE
Candida Vaginitis: POSITIVE — AB
Chlamydia: NEGATIVE
Comment: NEGATIVE
Comment: NEGATIVE
Comment: NEGATIVE
Comment: NEGATIVE
Comment: NEGATIVE
Comment: NORMAL
Neisseria Gonorrhea: NEGATIVE
Trichomonas: NEGATIVE

## 2022-02-25 ENCOUNTER — Encounter: Payer: Self-pay | Admitting: Internal Medicine

## 2022-02-25 ENCOUNTER — Other Ambulatory Visit: Payer: Self-pay | Admitting: Internal Medicine

## 2022-02-25 DIAGNOSIS — D508 Other iron deficiency anemias: Secondary | ICD-10-CM

## 2022-02-25 DIAGNOSIS — E559 Vitamin D deficiency, unspecified: Secondary | ICD-10-CM

## 2022-02-25 LAB — VITAMIN D 25 HYDROXY (VIT D DEFICIENCY, FRACTURES): VITD: 12.96 ng/mL — ABNORMAL LOW (ref 30.00–100.00)

## 2022-02-25 LAB — TSH: TSH: 2 u[IU]/mL (ref 0.35–5.50)

## 2022-02-25 LAB — VITAMIN B12: Vitamin B-12: 473 pg/mL (ref 211–911)

## 2022-02-25 MED ORDER — IRON (FERROUS SULFATE) 325 (65 FE) MG PO TABS: 325.0000 mg | ORAL_TABLET | Freq: Two times a day (BID) | ORAL | Status: DC

## 2022-02-25 MED ORDER — VITAMIN D (ERGOCALCIFEROL) 1.25 MG (50000 UNIT) PO CAPS: 50000.0000 [IU] | ORAL_CAPSULE | ORAL | 0 refills | Status: AC

## 2022-02-27 ENCOUNTER — Other Ambulatory Visit (INDEPENDENT_AMBULATORY_CARE_PROVIDER_SITE_OTHER): Payer: 59

## 2022-02-27 DIAGNOSIS — D508 Other iron deficiency anemias: Secondary | ICD-10-CM | POA: Diagnosis not present

## 2022-02-27 LAB — IBC PANEL
Iron: 27 ug/dL — ABNORMAL LOW (ref 42–145)
Saturation Ratios: 4.9 % — ABNORMAL LOW (ref 20.0–50.0)
TIBC: 553 ug/dL — ABNORMAL HIGH (ref 250.0–450.0)
Transferrin: 395 mg/dL — ABNORMAL HIGH (ref 212.0–360.0)

## 2022-02-27 LAB — FERRITIN: Ferritin: 5.5 ng/mL — ABNORMAL LOW (ref 10.0–291.0)

## 2022-03-03 ENCOUNTER — Other Ambulatory Visit: Payer: Self-pay | Admitting: Internal Medicine

## 2022-03-03 ENCOUNTER — Telehealth: Payer: Self-pay | Admitting: Internal Medicine

## 2022-03-03 DIAGNOSIS — D508 Other iron deficiency anemias: Secondary | ICD-10-CM

## 2022-03-03 MED ORDER — METRONIDAZOLE 500 MG PO TABS: ORAL_TABLET | ORAL | 0 refills | Status: DC

## 2022-03-03 NOTE — Telephone Encounter (Signed)
Pt was seen on 02-20-2022 and she is still waiting on diflucan to be sent to  ?[id number] [place], [place] - [address] Phone:  [phone]  ?Fax:  [phone]  ?  ? ?

## 2022-03-03 NOTE — Telephone Encounter (Signed)
Rx sent 

## 2022-03-05 NOTE — Telephone Encounter (Signed)
Patient is requesting for metroNIDAZOLE (FLAGYL) 500 MG tablet [[id number]]  to be sent in as gel because she cannot do the tablets. ? ?Please advise. ?

## 2022-03-06 NOTE — Telephone Encounter (Signed)
Denial Rx already faxed to pharmacy. ? ?

## 2022-03-21 DIAGNOSIS — N644 Mastodynia: Secondary | ICD-10-CM | POA: Diagnosis not present

## 2022-03-22 DIAGNOSIS — F411 Generalized anxiety disorder: Secondary | ICD-10-CM | POA: Diagnosis not present

## 2022-04-03 DIAGNOSIS — N644 Mastodynia: Secondary | ICD-10-CM | POA: Diagnosis not present

## 2022-04-03 DIAGNOSIS — R928 Other abnormal and inconclusive findings on diagnostic imaging of breast: Secondary | ICD-10-CM | POA: Diagnosis not present

## 2022-04-04 DIAGNOSIS — F419 Anxiety disorder, unspecified: Secondary | ICD-10-CM | POA: Diagnosis not present

## 2022-04-04 DIAGNOSIS — N6001 Solitary cyst of right breast: Secondary | ICD-10-CM | POA: Diagnosis not present

## 2022-04-04 DIAGNOSIS — N644 Mastodynia: Secondary | ICD-10-CM | POA: Diagnosis not present

## 2022-04-04 DIAGNOSIS — B3731 Acute candidiasis of vulva and vagina: Secondary | ICD-10-CM | POA: Diagnosis not present

## 2022-04-07 ENCOUNTER — Encounter: Payer: Self-pay | Admitting: Internal Medicine

## 2022-04-16 NOTE — Telephone Encounter (Signed)
Pt is still waiting for gel   ?[id number] [place], [place] - [address] Phone:  [phone]  ?Fax:  [phone]  ?  ? ?

## 2022-04-21 ENCOUNTER — Telehealth: Payer: Self-pay | Admitting: Internal Medicine

## 2022-04-21 NOTE — Telephone Encounter (Signed)
Patient called in due to the inability to swallow medication. Patient is requesting that the medication be called in as a gel.   Patient states that the taste and her gag reflex is horrible..  She is requesting Provider give her a detail call back and it is ok to leave a message.   Metronidazole 500 mg

## 2022-04-22 NOTE — Telephone Encounter (Signed)
Attempt to reach pt. Voicemail is full.  

## 2022-04-22 NOTE — Telephone Encounter (Signed)
Patient is aware of Dr Hardie Shackleton recommendations. Patient would like to talk to Dr Ardyth Harps.  A virtual visit was offered.  The patient declined.  Patient state that she will "come down there".  I explained that she will need an appointment.  Patient verbally understood.

## 2022-05-16 ENCOUNTER — Ambulatory Visit (INDEPENDENT_AMBULATORY_CARE_PROVIDER_SITE_OTHER)
Admission: RE | Admit: 2022-05-16 | Discharge: 2022-05-16 | Disposition: A | Discharge status: Home / Self Care | Payer: 59 | Source: Ambulatory Visit | Attending: Family | Admitting: Family

## 2022-05-16 ENCOUNTER — Ambulatory Visit: Payer: 59 | Admitting: Family

## 2022-05-16 ENCOUNTER — Telehealth: Payer: Self-pay | Admitting: Internal Medicine

## 2022-05-16 VITALS — BP 110/70 | HR 92 | Temp 98.8°F | Ht 60.0 in | Wt 135.8 lb

## 2022-05-16 DIAGNOSIS — M25512 Pain in left shoulder: Secondary | ICD-10-CM | POA: Diagnosis not present

## 2022-05-16 MED ORDER — IBUPROFEN 800 MG PO TABS: 800.0000 mg | ORAL_TABLET | Freq: Three times a day (TID) | ORAL | 0 refills | Status: DC

## 2022-05-16 NOTE — Telephone Encounter (Signed)
Patient would like to do a TOC to Dr.Banks from Dr.Hernandez. Okay to schedule?        Please advise

## 2022-05-20 ENCOUNTER — Telehealth: Payer: Self-pay | Admitting: Internal Medicine

## 2022-05-20 ENCOUNTER — Encounter: Payer: Self-pay | Admitting: Family

## 2022-05-20 NOTE — Telephone Encounter (Signed)
Pt states per mychart note, she was given the option of a muscle relaxer and she would like that prescription sent in.

## 2022-05-20 NOTE — Progress Notes (Signed)
   Acute Office Visit  Subjective:     Patient ID: Angela Ashley, female    DOB: 1986-03-16, 36 y.o.   MRN: [id number]  Chief Complaint  Patient presents with  . Shoulder Pain    Patient complains of left shoulder pain x2 weeks, no known injury    HPI Patient is in today with c/o left shoulder pain x 2 weeks without any known injury. Pain is 6/10, described as achy. Has been using OTC Bengay that has helped some.   Review of Systems  Musculoskeletal:        Left shoulder pain   Neurological: Negative.   All other systems reviewed and are negative.      Objective:    BP 110/70 (BP Location: Right Arm, Patient Position: Sitting, Cuff Size: Normal)   Pulse 92   Temp 98.8 F (37.1 C) (Oral)   Ht 5' (1.524 m)   Wt 135 lb 12.8 oz (61.6 kg)   LMP 05/04/2022 (Exact Date)   SpO2 96%   BMI 26.52 kg/m    Physical Exam Vitals and nursing note reviewed.  Constitutional:      Appearance: Normal appearance.  Cardiovascular:     Rate and Rhythm: Normal rate and regular rhythm.  Pulmonary:     Effort: Pulmonary effort is normal.     Breath sounds: Normal breath sounds.  Musculoskeletal:        General: Tenderness present. No swelling, deformity or signs of injury.     Cervical back: Normal range of motion and neck supple.     Comments: Left shoulder: No pain with forced flexion, extension, negative empty can test. Tenderness to palpation of the posterior aspect of the left shoulder.   Skin:    General: Skin is warm and dry.  Neurological:     General: No focal deficit present.     Mental Status: She is alert and oriented to person, place, and time.  Psychiatric:        Mood and Affect: Mood normal.        Behavior: Behavior normal.   No results found for any visits on 05/16/22.      Assessment & Plan:   Problem List Items Addressed This Visit   None Visit Diagnoses     Acute pain of left shoulder    -  Primary   Relevant Orders   DG Shoulder Left (Completed)        Meds ordered this encounter  Medications  . ibuprofen (ADVIL) 800 MG tablet    Sig: Take 1 tablet (800 mg total) by mouth every 8 (eight) hours.    Dispense:  30 tablet    Refill:  0    Call the office if symptoms worsen or persist. Follow-up pending xray and sooner as needed. Will follow-up pending xray results.   Eulis Foster, FNP

## 2022-05-21 ENCOUNTER — Telehealth: Payer: 59 | Admitting: Nurse Practitioner

## 2022-05-21 ENCOUNTER — Other Ambulatory Visit: Payer: Self-pay | Admitting: Family

## 2022-05-21 DIAGNOSIS — B9689 Other specified bacterial agents as the cause of diseases classified elsewhere: Secondary | ICD-10-CM

## 2022-05-21 DIAGNOSIS — N760 Acute vaginitis: Secondary | ICD-10-CM

## 2022-05-21 MED ORDER — METRONIDAZOLE 0.75 % VA GEL: 1.0000 | Freq: Every day | VAGINAL | 0 refills | Status: AC

## 2022-05-21 MED ORDER — CYCLOBENZAPRINE HCL 10 MG PO TABS: 10.0000 mg | ORAL_TABLET | Freq: Three times a day (TID) | ORAL | 0 refills | Status: DC | PRN

## 2022-05-21 NOTE — Telephone Encounter (Signed)
Ok

## 2022-05-21 NOTE — Progress Notes (Signed)
E-Visit for Vaginal Symptoms  We are sorry that you are not feeling well. Here is how we plan to help! Based on what you shared with me it looks like you: May have a vaginosis due to bacteria  Vaginosis is an inflammation of the vagina that can result in discharge, itching and pain. The cause is usually a change in the normal balance of vaginal bacteria or an infection. Vaginosis can also result from reduced estrogen levels after menopause.  The most common causes of vaginosis are:   Bacterial vaginosis which results from an overgrowth of one on several organisms that are normally present in your vagina.   Yeast infections which are caused by a naturally occurring fungus called candida.   Vaginal atrophy (atrophic vaginosis) which results from the thinning of the vagina from reduced estrogen levels after menopause.   Trichomoniasis which is caused by a parasite and is commonly transmitted by sexual intercourse.  Factors that increase your risk of developing vaginosis include: Medications, such as antibiotics and steroids Uncontrolled diabetes Use of hygiene products such as bubble bath, vaginal spray or vaginal deodorant Douching Wearing damp or tight-fitting clothing Using an intrauterine device (IUD) for birth control Hormonal changes, such as those associated with pregnancy, birth control pills or menopause Sexual activity Having a sexually transmitted infection  Your treatment plan is  Meds ordered this encounter  Medications   metroNIDAZOLE (METROGEL VAGINAL) 0.75 % vaginal gel    Sig: Place 1 Applicatorful vaginally at bedtime for 7 days.    Dispense:  70 g    Refill:  0    Be sure to take all of the medication as directed. Stop taking any medication if you develop a rash, tongue swelling or shortness of breath. Mothers who are breast feeding should consider pumping and discarding their breast milk while on these antibiotics. However, there is no consensus that infant  exposure at these doses would be harmful.  Remember that medication creams can weaken latex condoms. Marland Kitchen   HOME CARE:  Good hygiene may prevent some types of vaginosis from recurring and may relieve some symptoms:  Avoid baths, hot tubs and whirlpool spas. Rinse soap from your outer genital area after a shower, and dry the area well to prevent irritation. Don't use scented or harsh soaps, such as those with deodorant or antibacterial action. Avoid irritants. These include scented tampons and pads. Wipe from front to back after using the toilet. Doing so avoids spreading fecal bacteria to your vagina.  Other things that may help prevent vaginosis include:  Don't douche. Your vagina doesn't require cleansing other than normal bathing. Repetitive douching disrupts the normal organisms that reside in the vagina and can actually increase your risk of vaginal infection. Douching won't clear up a vaginal infection. Use a latex condom. Both female and female latex condoms may help you avoid infections spread by sexual contact. Wear cotton underwear. Also wear pantyhose with a cotton crotch. If you feel comfortable without it, skip wearing underwear to bed. Yeast thrives in [employer] Your symptoms should improve in the next day or two.  GET HELP RIGHT AWAY IF:  You have pain in your lower abdomen ( pelvic area or over your ovaries) You develop nausea or vomiting You develop a fever Your discharge changes or worsens You have persistent pain with intercourse You develop shortness of breath, a rapid pulse, or you faint.  These symptoms could be signs of problems or infections that need to be evaluated by a  medical provider now.  MAKE SURE YOU   Understand these instructions. Will watch your condition. Will get help right away if you are not doing well or get worse.  Thank you for choosing an e-visit.  Your e-visit answers were reviewed by a board certified advanced clinical  practitioner to complete your personal care plan. Depending upon the condition, your plan could have included both over the counter or prescription medications.  Please review your pharmacy choice. Make sure the pharmacy is open so you can pick up prescription now. If there is a problem, you may contact your provider through [employer] and have the prescription routed to another pharmacy.  Your safety is important to [place]. If you have drug allergies check your prescription carefully.   For the next 24 hours you can use MyChart to ask questions about today's visit, request a non-urgent call back, or ask for a work or school excuse. You will get an email in the next two days asking about your experience. I hope that your e-visit has been valuable and will speed your recovery.   I spent approximately 7 minutes reviewing the patient's history, current symptoms and coordinating their plan of care today.

## 2022-05-26 NOTE — Telephone Encounter (Signed)
Left voicemail letting patient know that she could call to schedule next available appointment with Dr.Banks      FYI

## 2022-05-29 ENCOUNTER — Other Ambulatory Visit (INDEPENDENT_AMBULATORY_CARE_PROVIDER_SITE_OTHER): Payer: 59

## 2022-05-29 DIAGNOSIS — D508 Other iron deficiency anemias: Secondary | ICD-10-CM | POA: Diagnosis not present

## 2022-05-29 DIAGNOSIS — E559 Vitamin D deficiency, unspecified: Secondary | ICD-10-CM

## 2022-05-29 LAB — CBC WITH DIFFERENTIAL/PLATELET
Basophils Absolute: 0.1 10*3/uL (ref 0.0–0.1)
Basophils Relative: 0.8 % (ref 0.0–3.0)
Eosinophils Absolute: 0.6 10*3/uL (ref 0.0–0.7)
Eosinophils Relative: 6.1 % — ABNORMAL HIGH (ref 0.0–5.0)
HCT: 31.8 % — ABNORMAL LOW (ref 36.0–46.0)
Hemoglobin: 10.1 g/dL — ABNORMAL LOW (ref 12.0–15.0)
Lymphocytes Relative: 24.1 % (ref 12.0–46.0)
Lymphs Abs: 2.5 10*3/uL (ref 0.7–4.0)
MCHC: 31.7 g/dL (ref 30.0–36.0)
MCV: 69.4 fl — ABNORMAL LOW (ref 78.0–100.0)
Monocytes Absolute: 0.7 10*3/uL (ref 0.1–1.0)
Monocytes Relative: 6.9 % (ref 3.0–12.0)
Neutro Abs: 6.4 10*3/uL (ref 1.4–7.7)
Neutrophils Relative %: 62.1 % (ref 43.0–77.0)
Platelets: 385 10*3/uL (ref 150.0–400.0)
RBC: 4.58 Mil/uL (ref 3.87–5.11)
RDW: 19.2 % — ABNORMAL HIGH (ref 11.5–15.5)
WBC: 10.3 10*3/uL (ref 4.0–10.5)

## 2022-05-29 LAB — VITAMIN D 25 HYDROXY (VIT D DEFICIENCY, FRACTURES): VITD: 22.66 ng/mL — ABNORMAL LOW (ref 30.00–100.00)

## 2022-06-13 ENCOUNTER — Ambulatory Visit: Payer: 59 | Admitting: Family

## 2022-06-13 ENCOUNTER — Other Ambulatory Visit (HOSPITAL_COMMUNITY)
Admission: RE | Admit: 2022-06-13 | Discharge: 2022-06-13 | Disposition: A | Discharge status: Home / Self Care | Payer: 59 | Source: Ambulatory Visit | Attending: Family | Admitting: Family

## 2022-06-13 ENCOUNTER — Encounter: Payer: Self-pay | Admitting: Family

## 2022-06-13 VITALS — BP 98/60 | HR 78 | Temp 97.9°F | Ht 60.0 in | Wt 131.8 lb

## 2022-06-13 DIAGNOSIS — Z7251 High risk heterosexual behavior: Secondary | ICD-10-CM | POA: Diagnosis not present

## 2022-06-13 DIAGNOSIS — D573 Sickle-cell trait: Secondary | ICD-10-CM

## 2022-06-13 DIAGNOSIS — N898 Other specified noninflammatory disorders of vagina: Secondary | ICD-10-CM

## 2022-06-13 DIAGNOSIS — Z113 Encounter for screening for infections with a predominantly sexual mode of transmission: Secondary | ICD-10-CM

## 2022-06-13 DIAGNOSIS — O99019 Anemia complicating pregnancy, unspecified trimester: Secondary | ICD-10-CM | POA: Diagnosis not present

## 2022-06-13 MED ORDER — METRONIDAZOLE 0.75 % VA GEL: 1.0000 | Freq: Every day | VAGINAL | 0 refills | Status: DC

## 2022-06-15 NOTE — Progress Notes (Signed)
Acute Office Visit  Subjective:     Patient ID: Angela Ashley, female    DOB: [birth year], 36 y.o.   MRN: [id number]  Chief Complaint  Patient presents with   Vaginal Discharge    Patient complains of clear, odorous vaginal discharge noted after menstrual cycles x8-9 months, seen by GYN and given a medication for a yeast infection    HPI Patient is in today with c/o  vaginal discharge and odor that has been ongoing x 8-9 months. It is typically after her menstrual cycle. She has seen GYN and evisit and been treated for bacterial vaginosis and yeast. She also is concerned that her husband has been unfaithful. She would like STI screening today.   Review of Systems  Constitutional: Negative.   Respiratory: Negative.    Cardiovascular: Negative.   Genitourinary:        Vaginal discharge and odor  All other systems reviewed and are negative.  Past Medical History:  Diagnosis Date   Abnormal Pap smear    Asthma    no inhaler   Bacterial vaginosis    reoccuring   Chlamydia    Herpes    Sickle cell trait (HCC)     Social History   Socioeconomic History   Marital status: Married    Spouse name: Not on file   Number of children: Not on file   Years of education: Not on file   Highest education level: Associate degree: occupational, Scientist, product/process development, or vocational program  Occupational History   Not on file  Tobacco Use   Smoking status: Former    Types: Cigarettes   Smokeless tobacco: Never  Vaping Use   Vaping Use: Never used  Substance and Sexual Activity   Alcohol use: Not Currently    Comment: Occassional use before pregnancy   Drug use: No   Sexual activity: Yes    Birth control/protection: None  Other Topics Concern   Not on file  Social History Narrative   Work or School: Ship broker      Home Situation: lives with children 4 and 6 in 03/2017      Spiritual Beliefs: Christian      Lifestyle: diet is ok other then soda, no regular aerobic exercise       Drinks wine 1 glass q 3 weeks.   Social Determinants of Health   Financial Resource Strain: Low Risk  (05/16/2022)   Overall Financial Resource Strain (CARDIA)    Difficulty of Paying Living Expenses: Not very hard  Food Insecurity: No Food Insecurity (05/16/2022)   Hunger Vital Sign    Worried About Running Out of Food in the Last Year: Never true    Ran Out of Food in the Last Year: Never true  Transportation Needs: No Transportation Needs (05/16/2022)   PRAPARE - Administrator, Civil Service (Medical): No    Lack of Transportation (Non-Medical): No  Physical Activity: Inactive (05/16/2022)   Exercise Vital Sign    Days of Exercise per Week: 3 days    Minutes of Exercise per Session: 0 min  Stress: Stress Concern Present (05/16/2022)   [employer] of Occupational Health - Occupational Stress Questionnaire    Feeling of Stress : To some extent  Social Connections: Socially Integrated (05/16/2022)   Social Connection and Isolation Panel [NHANES]    Frequency of Communication with Friends and Family: More than three times a week    Frequency of Social Gatherings with Friends and Family: Once  a week    Attends Religious Services: More than 4 times per year    Active Member of Clubs or Organizations: Yes    Attends Banker Meetings: 1 to 4 times per year    Marital Status: Married  Catering manager Violence: Not on file    Past Surgical History:  Procedure Laterality Date   CESAREAN SECTION N/A 03/26/2013   Procedure: Primary CESAREAN SECTION of baby girl  at  2051  APGAR 9/9;  Surgeon: Bing Plume, MD;  Location: [facility];  Service: Obstetrics;  Laterality: N/A;   CESAREAN SECTION WITH BILATERAL TUBAL LIGATION Bilateral 04/29/2020   Procedure: CESAREAN SECTION WITH BILATERAL TUBAL LIGATION;  Surgeon: Hoover Browns, MD;  Location: [facility];  Service: Obstetrics;  Laterality: Bilateral;   COLPOSCOPY W/ BIOPSY / CURETTAGE     Dr. in [place]     Family History  Problem Relation Age of Onset   Diabetes Paternal Grandmother    Healthy Mother    Healthy Father    Hypertension Maternal Grandmother     No Known Allergies  Current Outpatient Medications on File Prior to Visit  Medication Sig Dispense Refill   albuterol (VENTOLIN HFA) 108 (90 Base) MCG/ACT inhaler Inhale 2 puffs into the lungs every 6 (six) hours as needed for wheezing or shortness of breath. 8 g 0   cyclobenzaprine (FLEXERIL) 10 MG tablet Take 1 tablet (10 mg total) by mouth 3 (three) times daily as needed for muscle spasms. 30 tablet 0   ibuprofen (ADVIL) 800 MG tablet Take 1 tablet (800 mg total) by mouth every 8 (eight) hours. 30 tablet 0   Iron, Ferrous Sulfate, 325 (65 Fe) MG TABS Take 325 mg by mouth in the morning and at bedtime. 30 tablet    LORazepam (ATIVAN) 0.5 MG tablet lorazepam 0.5 mg tablet  TAKE ONE OR TWO PILLS EVERY 6 HOURS AS NEEDED FOR ANXIETY/INSOMNIA     sertraline (ZOLOFT) 50 MG tablet Take 1 tablet (50 mg total) by mouth daily. 90 tablet 1   No current facility-administered medications on file prior to visit.    BP 98/60 (BP Location: Left Arm, Patient Position: Sitting, Cuff Size: Normal)   Pulse 78   Temp 97.9 F (36.6 C) (Oral)   Ht 5' (1.524 m)   Wt 131 lb 12.8 oz (59.8 kg)   LMP 06/08/2022 (Exact Date)   SpO2 98%   BMI 25.74 kg/m chart      Objective:    BP 98/60 (BP Location: Left Arm, Patient Position: Sitting, Cuff Size: Normal)   Pulse 78   Temp 97.9 F (36.6 C) (Oral)   Ht 5' (1.524 m)   Wt 131 lb 12.8 oz (59.8 kg)   LMP 06/08/2022 (Exact Date)   SpO2 98%   BMI 25.74 kg/m    Physical Exam Vitals and nursing note reviewed. Exam conducted with a chaperone present.  Constitutional:      Appearance: Normal appearance.  Cardiovascular:     Rate and Rhythm: Normal rate and regular rhythm.  Pulmonary:     Effort: Pulmonary effort is normal.     Breath sounds: Normal breath sounds.  Abdominal:     General:  Abdomen is flat. Bowel sounds are normal.     Palpations: Abdomen is soft.  Genitourinary:    Vagina: Vaginal discharge present.     Cervix: Normal.     Uterus: Normal.   Musculoskeletal:        General: Normal range  of motion.     Cervical back: Normal range of motion.  Skin:    General: Skin is warm and dry.  Neurological:     General: No focal deficit present.     Mental Status: She is alert and oriented to person, place, and time.  Psychiatric:        Mood and Affect: Mood normal.        Behavior: Behavior normal.     Results for orders placed or performed in visit on 06/13/22  RPR  Result Value Ref Range   RPR Ser Ql NON-REACTIVE NON-REACTIVE        Assessment & Plan:   Problem List Items Addressed This Visit     Sickle cell trait in mother affecting pregnancy (HCC)   Other Visit Diagnoses     Screen for sexually transmitted diseases    -  Primary   Relevant Orders   POCT Wet Prep ([place])   GC/Chlamydia Probe Amp   HIV antibody (with reflex)   RPR (Completed)   Mycoplasma / Ureaplasma Culture   Vaginal discharge       Relevant Orders   POCT Wet Prep ([place]et [place])   GC/Chlamydia Probe Amp   HIV antibody (with reflex)   RPR (Completed)   Mycoplasma / Ureaplasma Culture   Cervicovaginal ancillary only   High risk heterosexual behavior           Meds ordered this encounter  Medications   metroNIDAZOLE (METROGEL VAGINAL) 0.75 % vaginal gel    Sig: Place 1 Applicatorful vaginally at bedtime.    Dispense:  70 g    Refill:  0   Encouraged boric acid suppositories after her menstrual cycle. Will treat today with metrogel. Call if symptoms worsen or persist. Will follow-up pending labs.    No follow-ups on file.  Eulis Foster, FNP

## 2022-06-16 LAB — HIV ANTIBODY (ROUTINE TESTING W REFLEX): HIV 1&2 Ab, 4th Generation: NONREACTIVE

## 2022-06-16 LAB — RPR: RPR Ser Ql: NONREACTIVE

## 2022-06-18 ENCOUNTER — Telehealth: Payer: Self-pay | Admitting: *Deleted

## 2022-06-18 LAB — CERVICOVAGINAL ANCILLARY ONLY
Bacterial Vaginitis (gardnerella): POSITIVE — AB
Candida Glabrata: NEGATIVE
Candida Vaginitis: POSITIVE — AB
Chlamydia: NEGATIVE
Comment: NEGATIVE
Comment: NEGATIVE
Comment: NEGATIVE
Comment: NEGATIVE
Comment: NEGATIVE
Comment: NORMAL
Neisseria Gonorrhea: NEGATIVE
Trichomonas: NEGATIVE

## 2022-06-18 NOTE — Telephone Encounter (Signed)
Spoke with Jeannine at [facility] and she stated the test cannot be added to the Aptima Swab previously received as the lab only performs STD testing.  Message sent to [facility].

## 2022-06-18 NOTE — Telephone Encounter (Signed)
-----   Message from Eulis Foster, FNP sent at 06/18/2022 10:14 AM EDT ----- Do you mind checking on this ----- Message ----- From: SYSTEM Sent: 06/18/2022  12:17 AM EDT To: Eulis Foster, FNP

## 2022-06-19 NOTE — Telephone Encounter (Signed)
Noted  

## 2022-06-22 ENCOUNTER — Other Ambulatory Visit: Payer: Self-pay | Admitting: Family

## 2022-06-22 MED ORDER — FLUCONAZOLE 150 MG PO TABS: 150.0000 mg | ORAL_TABLET | ORAL | 0 refills | Status: DC

## 2022-07-10 ENCOUNTER — Encounter: Payer: Self-pay | Admitting: Family Medicine

## 2022-07-10 ENCOUNTER — Ambulatory Visit (INDEPENDENT_AMBULATORY_CARE_PROVIDER_SITE_OTHER): Payer: 59 | Admitting: Family Medicine

## 2022-07-10 VITALS — BP 118/84 | HR 112 | Temp 99.0°F | Wt 136.0 lb

## 2022-07-10 DIAGNOSIS — U071 COVID-19: Secondary | ICD-10-CM

## 2022-07-10 DIAGNOSIS — J029 Acute pharyngitis, unspecified: Secondary | ICD-10-CM

## 2022-07-10 LAB — POC COVID19 BINAXNOW: SARS Coronavirus 2 Ag: POSITIVE — AB

## 2022-07-10 NOTE — Progress Notes (Signed)
Acute Office Visit Virtual Visit via Video Note  I connected with Angela Ashley on 07/10/22 at  3:00 PM EDT by a video enabled telemedicine application 2/2 COVID-19 pandemic and verified that I am speaking with the correct person using two identifiers.   Location patient: clinic exam room Location provider:work office Persons participating in the virtual visit: patient, provider  I discussed the limitations of evaluation and management by telemedicine and the availability of in person appointments. The patient expressed understanding and agreed to proceed.  Chief Complaint  Patient presents with   Acute Visit    Returned from a cruise a few days ago wants a COVID test.    HPI:  Patient is in today for acute concern.  Pt had itchy throat, kept clearing, since Monday night.  Pt endorses HA, L ear pain, rhinorrhea.  Denies n/v, fever, chills, diarrhea, SOB.  Pt just returned from a cruise.  States her friend on the trip developed a sore throat.  Pt works in the OR.  Scheduled to work on Monday, 07/14/2022.  ROS: See pertinent positives and negatives per HPI.  Past Medical History:  Diagnosis Date   Abnormal Pap smear    Asthma    no inhaler   Bacterial vaginosis    reoccuring   Chlamydia    Herpes    Sickle cell trait [facility])     Past Surgical History:  Procedure Laterality Date   CESAREAN SECTION N/A 03/26/2013   Procedure: Primary CESAREAN SECTION of baby girl  at  2051  APGAR 9/9;  Surgeon: Bing Plume, MD;  Location: [facility];  Service: Obstetrics;  Laterality: N/A;   CESAREAN SECTION WITH BILATERAL TUBAL LIGATION Bilateral 04/29/2020   Procedure: CESAREAN SECTION WITH BILATERAL TUBAL LIGATION;  Surgeon: Hoover Browns, MD;  [facility];  Service: Obstetrics;  Laterality: Bilateral;   COLPOSCOPY W/ BIOPSY / CURETTAGE     Dr. in [place]    Family History  Problem Relation Age of Onset   Diabetes Paternal Grandmother    Healthy Mother    Healthy Father     Hypertension Maternal Grandmother     Current Outpatient Medications:    albuterol (VENTOLIN HFA) 108 (90 Base) MCG/ACT inhaler, Inhale 2 puffs into the lungs every 6 (six) hours as needed for wheezing or shortness of breath., Disp: 8 g, Rfl: 0   cyclobenzaprine (FLEXERIL) 10 MG tablet, Take 1 tablet (10 mg total) by mouth 3 (three) times daily as needed for muscle spasms., Disp: 30 tablet, Rfl: 0   fluconazole (DIFLUCAN) 150 MG tablet, Take 1 tablet (150 mg total) by mouth once a week., Disp: 2 tablet, Rfl: 0   ibuprofen (ADVIL) 800 MG tablet, Take 1 tablet (800 mg total) by mouth every 8 (eight) hours., Disp: 30 tablet, Rfl: 0   Iron, Ferrous Sulfate, 325 (65 Fe) MG TABS, Take 325 mg by mouth in the morning and at bedtime., Disp: 30 tablet, Rfl:    LORazepam (ATIVAN) 0.5 MG tablet, lorazepam 0.5 mg tablet  TAKE ONE OR TWO PILLS EVERY 6 HOURS AS NEEDED FOR ANXIETY/INSOMNIA, Disp: , Rfl:    metroNIDAZOLE (METROGEL VAGINAL) 0.75 % vaginal gel, Place 1 Applicatorful vaginally at bedtime., Disp: 70 g, Rfl: 0   sertraline (ZOLOFT) 50 MG tablet, Take 1 tablet (50 mg total) by mouth daily. (Patient not taking: Reported on 07/10/2022), Disp: 90 tablet, Rfl: 1  EXAM:  VITALS per patient if applicable: RR between 12-20 bpm  GENERAL: alert, oriented, appears  well and in no acute distress  HEENT: atraumatic, conjunctiva clear, no obvious abnormalities on inspection of external nose and ears  NECK: normal movements of the head and neck  LUNGS: on inspection no signs of respiratory distress, breathing rate appears normal, no obvious gross SOB, gasping or wheezing  CV: Tachycardia.  No obvious cyanosis  MS: moves all visible extremities without noticeable abnormality  PSYCH/NEURO: pleasant and cooperative, no obvious depression or anxiety, speech and thought processing grossly intact  ASSESSMENT AND PLAN:  Discussed the following assessment and plan:  COVID-19 virus infection  Sore throat -  Plan: POC COVID-19  Pt with cough/cold symptoms x 4 days.  COVID testing positive in clinic.  Discussed r/b/a of antiviral medication.  Patient declines at this time.  Continue expectant management with OTC medications.  Discussed quarantine.  Given strict precautions.  Note given for work.  Follow-up as needed.  Patient initially scheduled as TOC visit however given lack of time 2/2 acute illness will need to reschedule TOC.   I discussed the assessment and treatment plan with the patient. The patient was provided an opportunity to ask questions and all were answered. The patient agreed with the plan and demonstrated an understanding of the instructions.   The patient was advised to call back or seek an in-person evaluation if the symptoms worsen or if the condition fails to improve as anticipated.  Deeann Saint, MD

## 2022-09-25 DIAGNOSIS — N760 Acute vaginitis: Secondary | ICD-10-CM | POA: Diagnosis not present

## 2023-04-09 ENCOUNTER — Encounter: Payer: Self-pay | Admitting: Family Medicine

## 2023-04-09 ENCOUNTER — Other Ambulatory Visit (INDEPENDENT_AMBULATORY_CARE_PROVIDER_SITE_OTHER): Payer: 59

## 2023-04-09 ENCOUNTER — Ambulatory Visit: Payer: 59 | Admitting: Family Medicine

## 2023-04-09 VITALS — BP 108/78 | HR 84 | Temp 97.9°F | Ht 60.0 in | Wt 132.4 lb

## 2023-04-09 DIAGNOSIS — F411 Generalized anxiety disorder: Secondary | ICD-10-CM | POA: Diagnosis not present

## 2023-04-09 DIAGNOSIS — D508 Other iron deficiency anemias: Secondary | ICD-10-CM

## 2023-04-09 DIAGNOSIS — M549 Dorsalgia, unspecified: Secondary | ICD-10-CM

## 2023-04-09 DIAGNOSIS — Z0000 Encounter for general adult medical examination without abnormal findings: Secondary | ICD-10-CM | POA: Diagnosis not present

## 2023-04-09 DIAGNOSIS — R5383 Other fatigue: Secondary | ICD-10-CM | POA: Diagnosis not present

## 2023-04-09 DIAGNOSIS — R519 Headache, unspecified: Secondary | ICD-10-CM | POA: Diagnosis not present

## 2023-04-09 LAB — HEMOGLOBIN A1C: Hgb A1c MFr Bld: 5 % (ref 4.6–6.5)

## 2023-04-09 LAB — IBC + FERRITIN
Ferritin: 6.7 ng/mL — ABNORMAL LOW (ref 10.0–291.0)
Iron: 51 ug/dL (ref 42–145)
Saturation Ratios: 9.9 % — ABNORMAL LOW (ref 20.0–50.0)
TIBC: 513.8 ug/dL — ABNORMAL HIGH (ref 250.0–450.0)
Transferrin: 367 mg/dL — ABNORMAL HIGH (ref 212.0–360.0)

## 2023-04-09 LAB — COMPREHENSIVE METABOLIC PANEL
ALT: 12 U/L (ref 0–35)
AST: 22 U/L (ref 0–37)
Albumin: 4.4 g/dL (ref 3.5–5.2)
Alkaline Phosphatase: 53 U/L (ref 39–117)
BUN: 9 mg/dL (ref 6–23)
CO2: 23 mEq/L (ref 19–32)
Calcium: 9.6 mg/dL (ref 8.4–10.5)
Chloride: 104 mEq/L (ref 96–112)
Creatinine, Ser: 0.79 mg/dL (ref 0.40–1.20)
GFR: 97.13 mL/min (ref >60.00)
Glucose, Bld: 85 mg/dL (ref 70–99)
Potassium: 3.8 mEq/L (ref 3.5–5.1)
Sodium: 138 mEq/L (ref 135–145)
Total Bilirubin: 0.6 mg/dL (ref 0.2–1.2)
Total Protein: 8.2 g/dL (ref 6.0–8.3)

## 2023-04-09 LAB — CBC WITH DIFFERENTIAL/PLATELET
Basophils Absolute: 0.1 10*3/uL (ref 0.0–0.1)
Basophils Relative: 0.9 % (ref 0.0–3.0)
Eosinophils Absolute: 0.4 10*3/uL (ref 0.0–0.7)
Eosinophils Relative: 4.8 % (ref 0.0–5.0)
HCT: 35.3 % — ABNORMAL LOW (ref 36.0–46.0)
Hemoglobin: 11.3 g/dL — ABNORMAL LOW (ref 12.0–15.0)
Lymphocytes Relative: 29.1 % (ref 12.0–46.0)
Lymphs Abs: 2.5 10*3/uL (ref 0.7–4.0)
MCHC: 31.8 g/dL (ref 30.0–36.0)
MCV: 69.6 fl — ABNORMAL LOW (ref 78.0–100.0)
Monocytes Absolute: 0.6 10*3/uL (ref 0.1–1.0)
Monocytes Relative: 6.7 % (ref 3.0–12.0)
Neutro Abs: 5 10*3/uL (ref 1.4–7.7)
Neutrophils Relative %: 58.5 % (ref 43.0–77.0)
Platelets: 327 10*3/uL (ref 150.0–400.0)
RBC: 5.08 Mil/uL (ref 3.87–5.11)
RDW: 19.3 % — ABNORMAL HIGH (ref 11.5–15.5)
WBC: 8.6 10*3/uL (ref 4.0–10.5)

## 2023-04-09 LAB — LIPID PANEL
Cholesterol: 177 mg/dL (ref 0–200)
HDL: 58.7 mg/dL (ref >39.00)
LDL Cholesterol: 108 mg/dL — ABNORMAL HIGH (ref 0–99)
NonHDL: 118.54
Total CHOL/HDL Ratio: 3
Triglycerides: 55 mg/dL (ref 0.0–149.0)
VLDL: 11 mg/dL (ref 0.0–40.0)

## 2023-04-09 LAB — TSH: TSH: 1.62 u[IU]/mL (ref 0.35–5.50)

## 2023-04-09 LAB — T4, FREE: Free T4: 0.79 ng/dL (ref 0.60–1.60)

## 2023-04-09 MED ORDER — HYDROXYZINE HCL 25 MG PO TABS
25.0000 mg | ORAL_TABLET | Freq: Every evening | ORAL | 0 refills | Status: AC | PRN
Start: 2023-04-09 — End: ?

## 2023-04-09 NOTE — Patient Instructions (Addendum)
A referral was placed for you to see the Neurologist for the pressure sensation in your forehead.  You should schedule a visit with an Ophthalmologist to have your vision rechecked.   Behavioral Health Services: -to make an appointment contact the office/provider you are interested in seeing.  No referral is needed.  The below is not an all inclusive list, but will help you get started.  [email or web address] -counseling located off of [address]  [email or web address] -website helps you find providers in your area  [facility] -Located off of [address] [place]. across from [address]  Dr. Jannifer Franklin is a Therapist, sports with [facility]. [phone]  [facility]  [facility]  -[address]  [phone] -a place in town that has counseling and Psychiatry services.
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
## 2023-05-28 ENCOUNTER — Ambulatory Visit: Payer: 59 | Admitting: Family Medicine

## 2023-06-22 DIAGNOSIS — M79671 Pain in right foot: Secondary | ICD-10-CM | POA: Diagnosis not present

## 2023-06-30 DIAGNOSIS — M79671 Pain in right foot: Secondary | ICD-10-CM | POA: Diagnosis not present

## 2023-07-10 DIAGNOSIS — F419 Anxiety disorder, unspecified: Secondary | ICD-10-CM | POA: Diagnosis not present

## 2023-07-10 DIAGNOSIS — Z309 Encounter for contraceptive management, unspecified: Secondary | ICD-10-CM | POA: Diagnosis not present

## 2023-07-17 DIAGNOSIS — M84374A Stress fracture, right foot, initial encounter for fracture: Secondary | ICD-10-CM | POA: Diagnosis not present

## 2023-11-06 ENCOUNTER — Ambulatory Visit: Payer: 59 | Admitting: Family Medicine

## 2023-11-06 ENCOUNTER — Encounter: Payer: Self-pay | Admitting: Family Medicine

## 2023-11-06 VITALS — BP 108/70 | HR 87 | Temp 99.4°F | Ht 60.0 in | Wt 138.0 lb

## 2023-11-06 DIAGNOSIS — J029 Acute pharyngitis, unspecified: Secondary | ICD-10-CM

## 2023-11-06 DIAGNOSIS — R519 Headache, unspecified: Secondary | ICD-10-CM

## 2023-11-06 LAB — CBC WITH DIFFERENTIAL/PLATELET
Basophils Absolute: 0.1 10*3/uL (ref 0.0–0.1)
Basophils Relative: 0.9 % (ref 0.0–3.0)
Eosinophils Absolute: 0.3 10*3/uL (ref 0.0–0.7)
Eosinophils Relative: 3.6 % (ref 0.0–5.0)
HCT: 34.6 % — ABNORMAL LOW (ref 36.0–46.0)
Hemoglobin: 11.6 g/dL — ABNORMAL LOW (ref 12.0–15.0)
Lymphocytes Relative: 23.9 % (ref 12.0–46.0)
Lymphs Abs: 2.1 10*3/uL (ref 0.7–4.0)
MCHC: 33.5 g/dL (ref 30.0–36.0)
MCV: 70.3 fL — ABNORMAL LOW (ref 78.0–100.0)
Monocytes Absolute: 0.6 10*3/uL (ref 0.1–1.0)
Monocytes Relative: 6.8 % (ref 3.0–12.0)
Neutro Abs: 5.8 10*3/uL (ref 1.4–7.7)
Neutrophils Relative %: 64.8 % (ref 43.0–77.0)
Platelets: 343 10*3/uL (ref 150.0–400.0)
RBC: 4.93 Mil/uL (ref 3.87–5.11)
RDW: 19.2 % — ABNORMAL HIGH (ref 11.5–15.5)
WBC: 8.9 10*3/uL (ref 4.0–10.5)

## 2023-11-06 LAB — POCT RAPID STREP A (OFFICE): Rapid Strep A Screen: NEGATIVE

## 2023-11-06 LAB — T4, FREE: Free T4: 0.72 ng/dL (ref 0.60–1.60)

## 2023-11-06 LAB — TSH: TSH: 0.95 u[IU]/mL (ref 0.35–5.50)
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
## 2023-11-07 LAB — IRON,TIBC AND FERRITIN PANEL
%SAT: 5 % — ABNORMAL LOW (ref 16–45)
Ferritin: 5 ng/mL — ABNORMAL LOW (ref 16–154)
Iron: 24 ug/dL — ABNORMAL LOW (ref 40–190)
TIBC: 487 ug/dL — ABNORMAL HIGH (ref 250–450)

## 2023-11-16 ENCOUNTER — Encounter: Payer: Self-pay | Admitting: Internal Medicine

## 2023-11-16 ENCOUNTER — Ambulatory Visit: Payer: 59 | Admitting: Internal Medicine

## 2023-11-16 VITALS — BP 110/70 | HR 91 | Temp 98.6°F | Wt 140.3 lb

## 2023-11-16 DIAGNOSIS — J029 Acute pharyngitis, unspecified: Secondary | ICD-10-CM

## 2023-11-16 NOTE — Progress Notes (Signed)
Established Patient Office Visit     CC/Reason for Visit: Discuss acute concerns  HPI: Angela Ashley is a 37 y.o. female who is coming in today for the above mentioned reasons.  Has been describing a sore throat, right ear pain that started about 4 to 6 weeks ago.  She saw her PCP last week where a rapid strep test was negative.  She feels like over the weekend she developed some white papules on the inside of her lip and cheek.  Wonders if she needs a chest x-ray.   Past Medical/Surgical History: Past Medical History:  Diagnosis Date   Abnormal Pap smear    Asthma    no inhaler   Bacterial vaginosis    reoccuring   Chlamydia    Herpes    Sickle cell trait [facility])     Past Surgical History:  Procedure Laterality Date   CESAREAN SECTION N/A 03/26/2013   Procedure: Primary CESAREAN SECTION of baby girl  at  2051  APGAR 9/9;  Surgeon: Bing Plume, MD;  Location: [facility];  Service: Obstetrics;  Laterality: N/A;   CESAREAN SECTION WITH BILATERAL TUBAL LIGATION Bilateral 04/29/2020   Procedure: CESAREAN SECTION WITH BILATERAL TUBAL LIGATION;  Surgeon: Hoover Browns, MD;  Location: [facility];  Service: Obstetrics;  Laterality: Bilateral;   COLPOSCOPY W/ BIOPSY / CURETTAGE     Dr. in [place]    Social History:  reports that she has quit smoking. Her smoking use included cigarettes. She has never used smokeless tobacco. She reports that she does not currently use alcohol. She reports that she does not use drugs.  Allergies: No Known Allergies  Family History:  Family History  Problem Relation Age of Onset   Diabetes Paternal Grandmother    Healthy Mother    Healthy Father    Hypertension Maternal Grandmother      Current Outpatient Medications:    hydrOXYzine (ATARAX) 25 MG tablet, Take 1 tablet (25 mg total) by mouth at bedtime as needed for anxiety., Disp: 30 tablet, Rfl: 0  Review of Systems:  Negative unless indicated in HPI.   Physical Exam: Vitals:    11/16/23 1601  BP: 110/70  Pulse: 91  Temp: 98.6 F (37 C)  TempSrc: Oral  SpO2: 99%  Weight: 140 lb 4.8 oz (63.6 kg)    Body mass index is 27.4 kg/m.   Physical Exam Vitals reviewed.  Constitutional:      Appearance: Normal appearance.  HENT:     Right Ear: Tympanic membrane, ear canal and external ear normal.     Left Ear: Tympanic membrane, ear canal and external ear normal.     Mouth/Throat:     Mouth: Mucous membranes are moist.     Pharynx: Oropharynx is clear.  Eyes:     Conjunctiva/sclera: Conjunctivae normal.     Pupils: Pupils are equal, round, and reactive to light.  Cardiovascular:     Rate and Rhythm: Normal rate and regular rhythm.  Pulmonary:     Effort: Pulmonary effort is normal.     Breath sounds: Normal breath sounds.  Neurological:     Mental Status: She is alert.      Impression and Plan:  Sore throat  -Suspect symptoms may be related to postnasal drip or maybe atypical GERD.  Have advised daily antihistamine use.  Will follow-up with PCP. -No white papules on lip or cheek are evident today, I see no indication for chest x-ray with current symptoms.  Time spent:22 minutes reviewing chart, interviewing and examining patient and formulating plan of care.     Chaya Jan, MD [facility]

## 2023-11-17 ENCOUNTER — Ambulatory Visit: Payer: 59 | Admitting: Neurology

## 2024-01-07 DIAGNOSIS — K219 Gastro-esophageal reflux disease without esophagitis: Secondary | ICD-10-CM | POA: Diagnosis not present

## 2024-01-07 DIAGNOSIS — R09A2 Foreign body sensation, throat: Secondary | ICD-10-CM | POA: Diagnosis not present

## 2024-03-17 ENCOUNTER — Ambulatory Visit: Admitting: Family Medicine

## 2024-03-17 ENCOUNTER — Encounter: Payer: Self-pay | Admitting: Family Medicine

## 2024-03-17 VITALS — BP 110/88 | HR 94 | Temp 98.9°F | Ht 60.0 in | Wt 139.0 lb

## 2024-03-17 DIAGNOSIS — K120 Recurrent oral aphthae: Secondary | ICD-10-CM

## 2024-03-17 DIAGNOSIS — Z113 Encounter for screening for infections with a predominantly sexual mode of transmission: Secondary | ICD-10-CM | POA: Diagnosis not present

## 2024-03-17 NOTE — Progress Notes (Signed)
 Established Patient Office Visit   Subjective  Patient ID: Angela Ashley, female    DOB: 1985/12/31  Age: 38 y.o. MRN: [id number]  Chief Complaint  Patient presents with   Mouth Lesions    Pt c/o canker sore on inside bottom lip. Notice sx on Monday of this week. Pt would like to check STD.     Patient is a 38 year old female seen for acute concern.  Pt with a sore in inside of bottom lip x 5 days.  Has not tried anything for area.  Pt concerned due to history of HSV.  Dx'd several yrs ago.  At the time husband was cheating. Was unsure if she needed antiviral medication. Also requesting STI testing.    Patient Active Problem List   Diagnosis Date Noted   Sickle cell trait in mother affecting pregnancy (HCC) 06/13/2022   Vitamin D deficiency 02/25/2022   Postpartum care following cesarean delivery 04/30/2020   Anemia, iron deficiency 04/30/2020   Encounter for maternal care for low transverse scar from previous cesarean delivery 04/29/2020   Encounter for maternal care for scar from repeat cesarean delivery 04/29/2020   Status post repeat low transverse cesarean section 04/29/2020   S/P tubal ligation 04/29/2020   Morning sickness 09/26/2019   Intrauterine pregnancy 09/26/2019   Subchorionic hemorrhage 09/26/2019   Bacterial vaginosis 06/20/2015   Past Medical History:  Diagnosis Date   Abnormal Pap smear    Asthma    no inhaler   Bacterial vaginosis    reoccuring   Chlamydia    Herpes    Sickle cell trait [facility])    Past Surgical History:  Procedure Laterality Date   CESAREAN SECTION N/A 03/26/2013   Procedure: Primary CESAREAN SECTION of baby girl  at  2051  APGAR 9/9;  Surgeon: Bing Plume, MD;  Location: [facility];  Service: Obstetrics;  Laterality: N/A;   CESAREAN SECTION WITH BILATERAL TUBAL LIGATION Bilateral 04/29/2020   Procedure: CESAREAN SECTION WITH BILATERAL TUBAL LIGATION;  Surgeon: Hoover Browns, MD;  [facility]: [facility];  Service: Obstetrics;  Laterality:  Bilateral;   COLPOSCOPY W/ BIOPSY / CURETTAGE     Dr. in [place]   Social History   Tobacco Use   Smoking status: Former    Types: Cigarettes   Smokeless tobacco: Never  Vaping Use   Vaping status: Never Used  Substance Use Topics   Alcohol use: Not Currently    Comment: Occassional use before pregnancy   Drug use: No   Family History  Problem Relation Age of Onset   Diabetes Paternal Grandmother    Healthy Mother    Healthy Father    Hypertension Maternal Grandmother    No Known Allergies    ROS Negative unless stated above    Objective:     BP 110/88 (BP Location: Left Arm, Patient Position: Sitting, Cuff Size: Normal)   Pulse 94   Temp 98.9 F (37.2 C) (Oral)   Ht 5' (1.524 m)   Wt 139 lb (63 kg)   LMP 03/09/2024 (Exact Date)   SpO2 98%   BMI 27.15 kg/m  BP Readings from Last 3 Encounters:  03/17/24 110/88  11/16/23 110/70  11/06/23 108/70   Wt Readings from Last 3 Encounters:  03/17/24 139 lb (63 kg)  11/16/23 140 lb 4.8 oz (63.6 kg)  11/06/23 138 lb (62.6 kg)      Physical Exam Constitutional:      Appearance: Normal appearance.  HENT:  Head: Normocephalic and atraumatic.     Nose: Nose normal.     Mouth/Throat:     Mouth: Mucous membranes are moist. Oral lesions present.     Comments: Reddish grey bump on inside of lower lip midline. Cardiovascular:     Rate and Rhythm: Normal rate.  Pulmonary:     Effort: Pulmonary effort is normal.  Skin:    General: Skin is warm and dry.  Neurological:     Mental Status: She is alert and oriented to person, place, and time.    No results found for any visits on 03/17/24.    Assessment & Plan:  Canker sore  Routine screening for STI (sexually transmitted infection) -     HIV Antibody (routine testing w rflx) -     RPR  Supportive care for canker sore.  Reassurance, as not related to h/o HSV.  Discussed need for antiviral ppx if having >4 outbreaks/yr.  STI screening per request.   Offered G/C, pt declines.  Return if symptoms worsen or fail to improve.   Viola Greulich, MD

## 2024-03-18 ENCOUNTER — Encounter: Payer: Self-pay | Admitting: Family Medicine

## 2024-03-18 LAB — HIV ANTIBODY (ROUTINE TESTING W REFLEX): HIV 1&2 Ab, 4th Generation: NONREACTIVE

## 2024-03-18 LAB — RPR: RPR Ser Ql: NONREACTIVE

## 2024-03-21 ENCOUNTER — Ambulatory Visit: Payer: 59 | Admitting: Neurology

## 2024-03-28 ENCOUNTER — Ambulatory Visit: Admitting: Family Medicine

## 2024-04-13 ENCOUNTER — Ambulatory Visit: Admitting: Neurology

## 2024-04-14 DIAGNOSIS — Z113 Encounter for screening for infections with a predominantly sexual mode of transmission: Secondary | ICD-10-CM | POA: Diagnosis not present

## 2024-04-14 DIAGNOSIS — Z124 Encounter for screening for malignant neoplasm of cervix: Secondary | ICD-10-CM | POA: Diagnosis not present

## 2024-04-14 DIAGNOSIS — N920 Excessive and frequent menstruation with regular cycle: Secondary | ICD-10-CM | POA: Diagnosis not present

## 2024-04-14 DIAGNOSIS — Z1151 Encounter for screening for human papillomavirus (HPV): Secondary | ICD-10-CM | POA: Diagnosis not present

## 2024-04-15 IMAGING — DX DG SHOULDER 2+V*L*
3 series · 3 of 3 positions shown · non-contrast
Comparison: None Available.

CLINICAL DATA: Posterior left shoulder pain for 2 weeks.

EXAM:
LEFT SHOULDER - 2+ VIEW

[grashey]
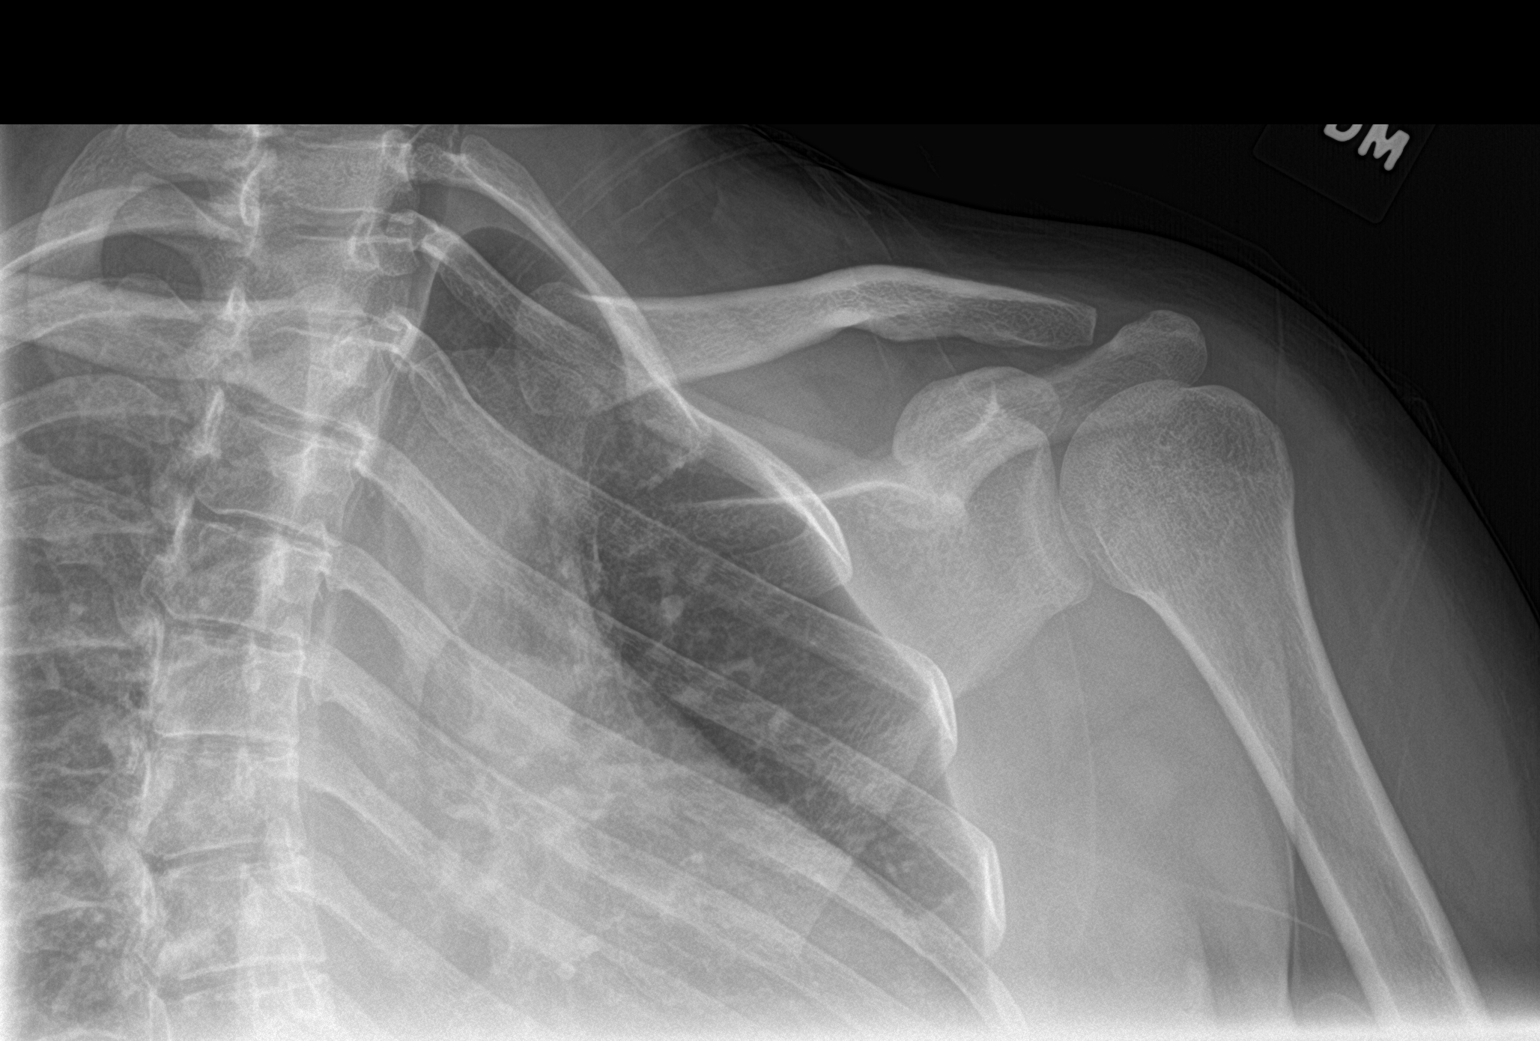

[y view]
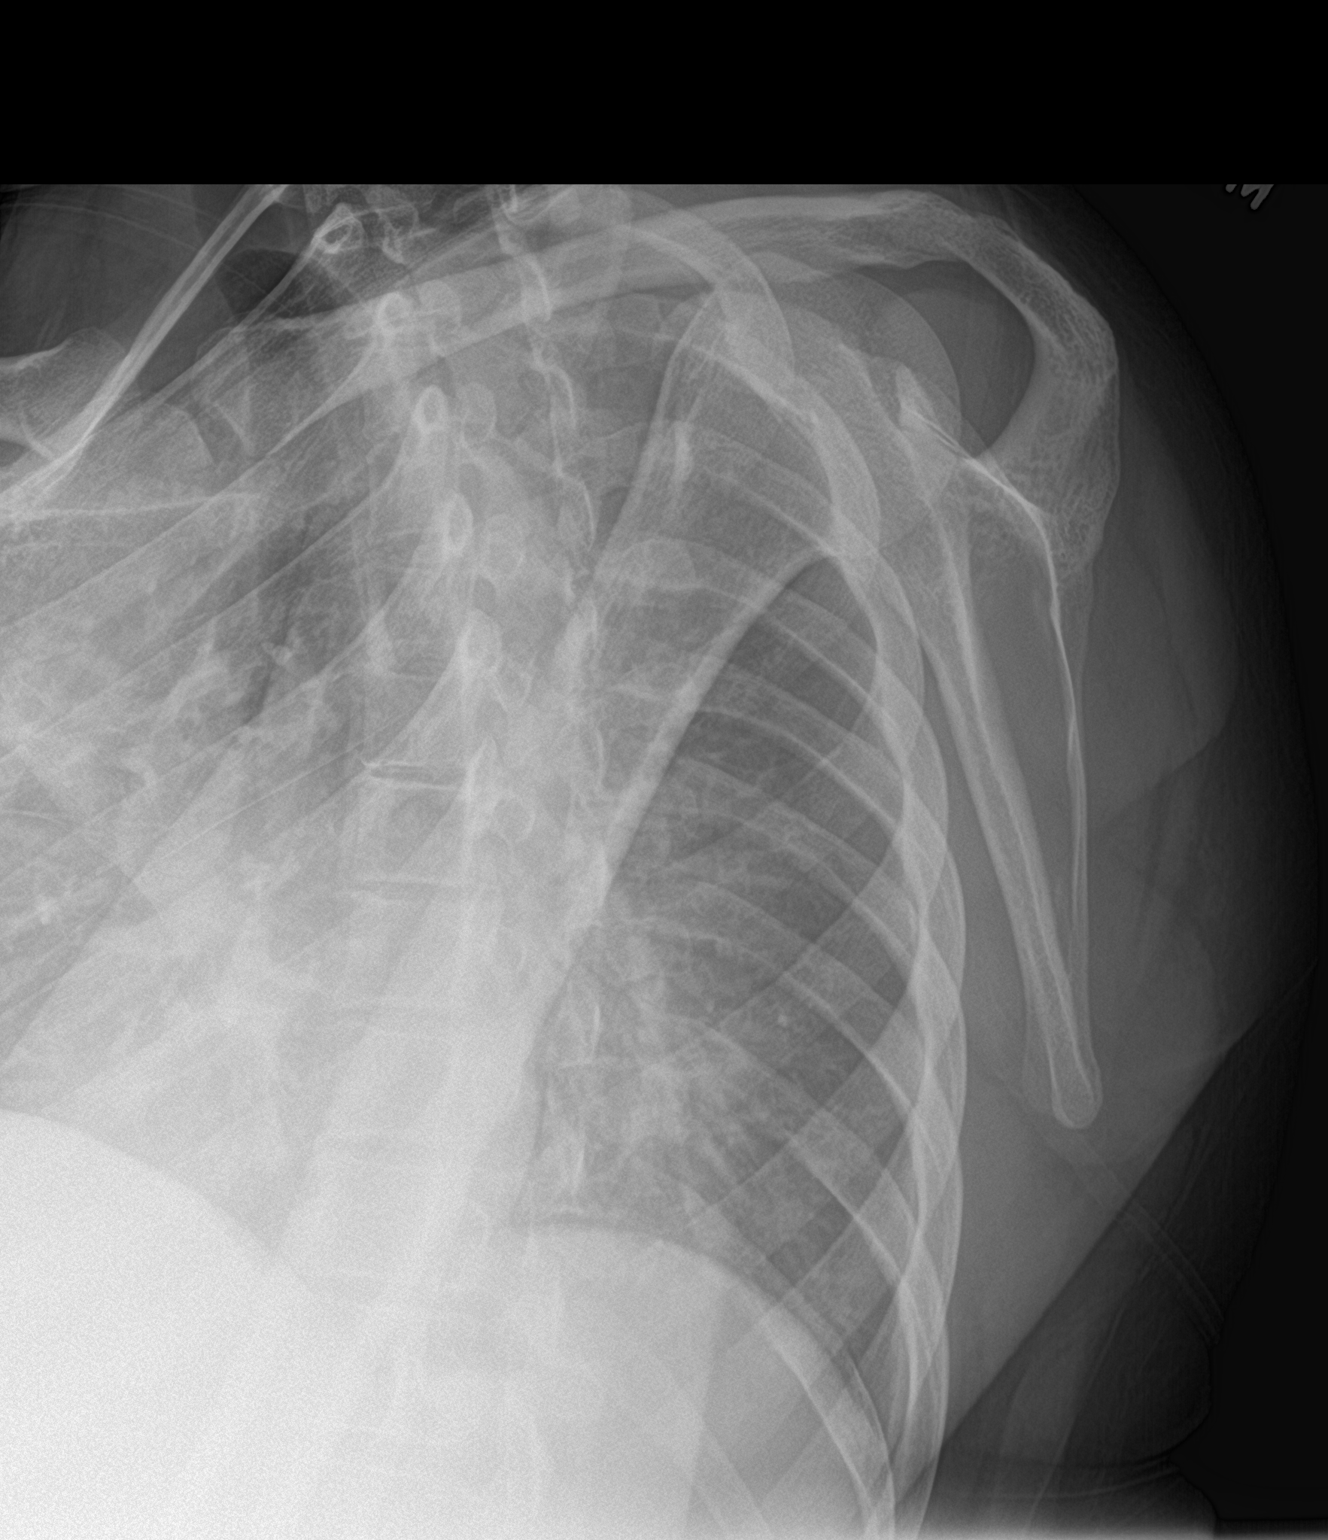

[shoulder axial]
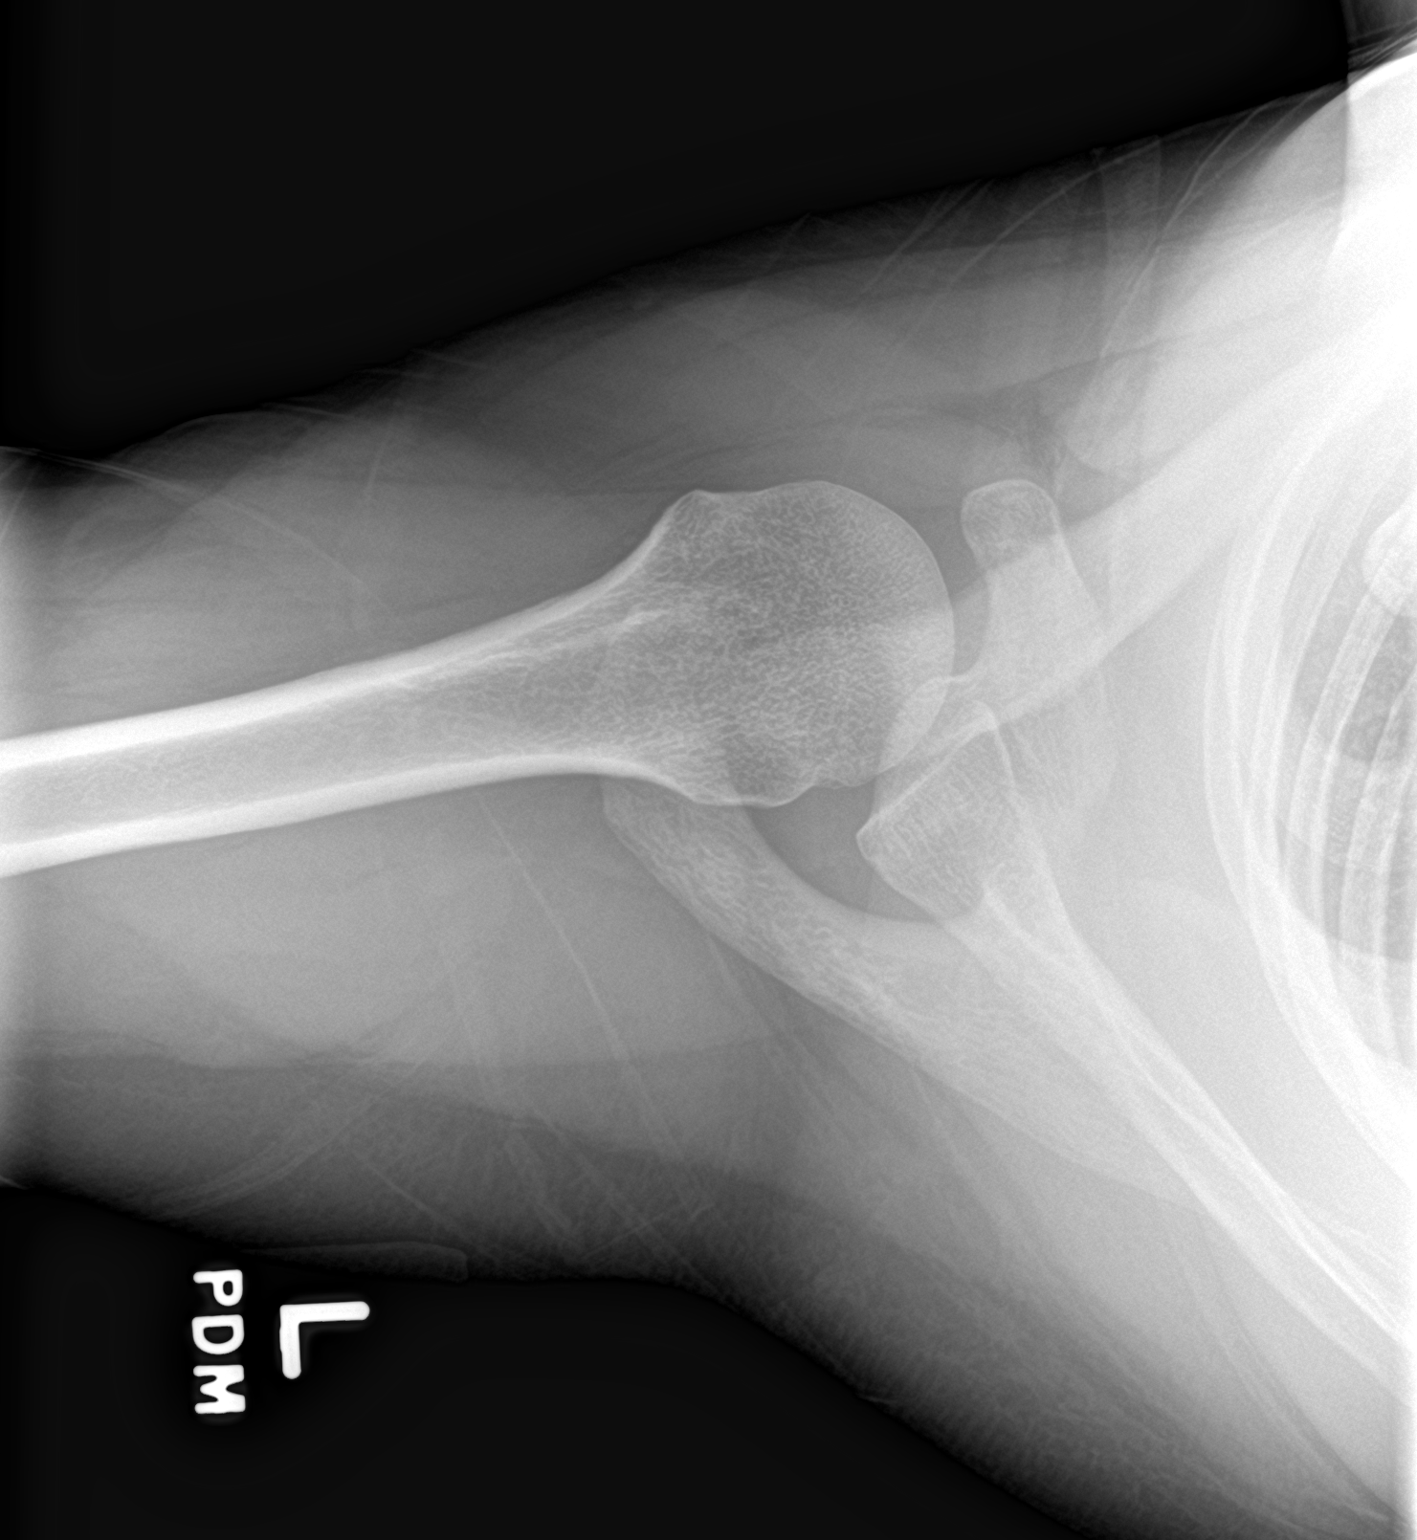

[3 of 3 positions shown; findings below may reference images not displayed]

FINDINGS: Normal bone mineralization. The glenohumeral and acromioclavicular
joints are appropriately aligned and maintained. No acute fracture
is seen. No dislocation. The visualized portion of the left lung is
unremarkable.
IMPRESSION: Normal left shoulder radiographs.

## 2024-06-07 NOTE — Progress Notes (Unsigned)
 NEUROLOGY CONSULTATION NOTE  Angela Ashley MRN: [id number] DOB: 1986-08-09  Referring provider: Clotilda Single, MD Primary care provider: Clotilda Single, MD  Reason for consult:  headache  Assessment/Plan:   Right-sided headache, may be tension-type vs migraine Paroxysmal headache Episodic visual disturbance - unclear etiology.  Unusually short for migraine aura   Check MRI of brain with and without contrast She wants to continue working on lifestyle modification before considering pharmacologic preventative management Advised to take Excedrin at earliest onset of headche Limit use of pain relievers to no more than 9 days out of the month to prevent risk of rebound or medication-overuse headache. Keep headache diary Follow up 8 months.   Subjective:  Angela Ashley is a 38 year old female with Sickle cell trait who presents for headache.  History supplemented by referring provider's notes.  Headache 1: Onset:  2023 Location:  right frontal/temporal Quality:  pressure Intensity:  6/10.   Aura:  absent Prodrome:  absent Associated symptoms:  none.  She denies associated nausea, vomiting, photophobia, phonophobia, visual disturbance, unilateral numbness or weakness. Duration:  1 to 2 days (tries not to treat them, waits until severe) Frequency:  3 times a month Frequency of abortive medication: rare Triggers:  possibly menstrual cycle, emotional stress Relieving factors:  rest Activity:  aggravates  Headache 2: Onset:  approximately 2015 Location:  right frontal/temporal that migrates to the left side Quality:  sharp-shootng Intensity:  7/10.   Aura:  absent Prodrome:  absent Associated symptoms:  None Duration:  2-3 minutes Frequency:  sporadic Triggers:  none Relieving factors:  none  Visual disturbance: Onset:  within last 6 months Sometimes sees stars in both eyes.   Lasts no longer than a minute Frequent but not daily No associated headache,  lightheadedness No known triggers  Past NSAIDS/analgesics:  Tylenol , ibuprofen  Past abortive triptans:  none Past abortive ergotamine:  none Past muscle relaxants:  none Past anti-emetic:  none Past antihypertensive medications:  none Past antidepressant medications:  mirtazapine , sertraline  Past anticonvulsant medications:  none Past anti-CGRP:  none Past antihistamines/decongestants:  Benadryl , scopolamine  Other past therapies:  none  Current NSAIDS/analgesics:  Excedrin Current triptans:  none Current ergotamine:  none Current anti-emetic:  none Current muscle relaxants:  none Current Antihypertensive medications:  none Current Antidepressant medications:  none Current Anticonvulsant medications:  none Current anti-CGRP:  none Current Vitamins/Herbal/Supplements:  none Current Antihistamines/Decongestants:  none Other therapy:  none Birth control:  none Other medications:  hydroxyzine    Caffeine:  Occasional soda or coffee Diet:  Juice, flavored water , chicken, avocado, salad.  Limits pork and red meat Exercise:  started going to exercise class/dance Depression:  no; Anxiety:  some Sleep hygiene:  good Decreased work schedule which will hopefully help.  She works two jobs and cares for 3 children.    History of head trauma/concussion:  no Family history of headache:  sister (migraines), mom (migraines) Other family history:  maternal aunt (stroke), paternal first cousin (stroke at age 63-32) Family history of aneurysm:  unknown      PAST MEDICAL HISTORY: Past Medical History:  Diagnosis Date   Abnormal Pap smear    Asthma    no inhaler   Bacterial vaginosis    reoccuring   Chlamydia    Herpes    Sickle cell trait (HCC)     PAST SURGICAL HISTORY: Past Surgical History:  Procedure Laterality Date   CESAREAN SECTION N/A 03/26/2013   Procedure: Primary CESAREAN SECTION of baby girl  at  2051  APGAR 9/9;  Surgeon: Debby JULIANNA Lares, MD;  Location: [facility];   Service: Obstetrics;  Laterality: N/A;   CESAREAN SECTION WITH BILATERAL TUBAL LIGATION Bilateral 04/29/2020   Procedure: CESAREAN SECTION WITH BILATERAL TUBAL LIGATION;  Surgeon: Gloriann Chick, MD;  [facility]: [facility];  Service: Obstetrics;  Laterality: Bilateral;   COLPOSCOPY W/ BIOPSY / CURETTAGE     Dr. in [place]    MEDICATIONS: Current Outpatient Medications on File Prior to Visit  Medication Sig Dispense Refill   hydrOXYzine  (ATARAX ) 25 MG tablet Take 1 tablet (25 mg total) by mouth at bedtime as needed for anxiety. 30 tablet 0   No current facility-administered medications on file prior to visit.    ALLERGIES: No Known Allergies  FAMILY HISTORY: Family History  Problem Relation Age of Onset   Diabetes Paternal Grandmother    Healthy Mother    Healthy Father    Hypertension Maternal Grandmother     Objective:  Blood pressure 104/71, pulse 89, height 5' (1.524 m), weight 137 lb (62.1 kg), SpO2 100%, unknown if currently breastfeeding. General: No acute distress.  Patient appears well-groomed.   Head:  Normocephalic/atraumatic Eyes:  fundi examined but not visualized Neck: supple, no paraspinal tenderness, full range of motion Heart: regular rate and rhythm Neurological Exam: Mental status: alert and oriented to person, place, and time, speech fluent and not dysarthric, language intact. Cranial nerves: CN I: not tested CN II: pupils equal, round and reactive to light, visual fields intact CN III, IV, VI:  full range of motion, no nystagmus, no ptosis CN V: facial sensation intact. CN VII: upper and lower face symmetric CN VIII: hearing intact CN IX, X: gag intact, uvula midline CN XI: sternocleidomastoid and trapezius muscles intact CN XII: tongue midline Bulk & Tone: normal, no fasciculations. Motor:  muscle strength 5/5 throughout Sensation:  Pinprick and vibratory sensation intact. Deep Tendon Reflexes:  2+ throughout,  toes downgoing.   Finger to nose  testing:  Without dysmetria.   Gait:  Normal station and stride.  Romberg negative.    Thank you for allowing me to take part in the care of this patient.  Juliene Dunnings, DO  CC: Clotilda Single, MD

## 2024-06-08 ENCOUNTER — Encounter: Payer: Self-pay | Admitting: Neurology

## 2024-06-08 ENCOUNTER — Ambulatory Visit (INDEPENDENT_AMBULATORY_CARE_PROVIDER_SITE_OTHER): Admitting: Neurology

## 2024-06-08 VITALS — BP 104/71 | HR 89 | Ht 60.0 in | Wt 137.0 lb

## 2024-06-08 DIAGNOSIS — H539 Unspecified visual disturbance: Secondary | ICD-10-CM | POA: Diagnosis not present

## 2024-06-08 DIAGNOSIS — R519 Headache, unspecified: Secondary | ICD-10-CM | POA: Diagnosis not present

## 2024-06-08 MED ORDER — DIAZEPAM 5 MG PO TABS: ORAL_TABLET | ORAL | 0 refills | Status: AC

## 2024-06-08 NOTE — Patient Instructions (Addendum)
  Check MRI of brain with and without contrast.  May take diazepam  prior to test but will need a driver Take Excedrin at earliest onset of headache.   Limit use of pain relievers to no more than 9 days out of the month.  These medications include acetaminophen , NSAIDs (ibuprofen /Advil /Motrin , naproxen/Aleve, triptans (Imitrex/sumatriptan), Excedrin, and narcotics.  This will help reduce risk of rebound headaches. Be aware of common food triggers Routine exercise Stay adequately hydrated (aim for 64 oz water  daily) Keep headache diary Maintain proper stress management Maintain proper sleep hygiene Do not skip meals Follow up 8 months. Let me know if you think we need to start a daily preventative medication

## 2024-07-07 ENCOUNTER — Ambulatory Visit
Admission: RE | Admit: 2024-07-07 | Discharge: 2024-07-07 | Disposition: A | Discharge status: Home / Self Care | Source: Ambulatory Visit | Attending: Neurology | Admitting: Neurology

## 2024-07-07 DIAGNOSIS — R519 Headache, unspecified: Secondary | ICD-10-CM

## 2024-07-07 DIAGNOSIS — H539 Unspecified visual disturbance: Secondary | ICD-10-CM

## 2024-07-07 MED ORDER — GADOPICLENOL 0.5 MMOL/ML IV SOLN
6.0000 mL | Freq: Once | INTRAVENOUS | Status: AC | PRN
  Administered 2024-07-07: 6 mL via INTRAVENOUS

## 2024-07-11 ENCOUNTER — Ambulatory Visit: Payer: Self-pay | Admitting: Neurology

## 2024-07-11 NOTE — Progress Notes (Signed)
 Patient advised.

## 2024-08-08 ENCOUNTER — Ambulatory Visit: Admitting: Neurology

## 2024-09-21 ENCOUNTER — Encounter: Payer: Self-pay | Admitting: Family Medicine

## 2024-09-21 ENCOUNTER — Ambulatory Visit (INDEPENDENT_AMBULATORY_CARE_PROVIDER_SITE_OTHER): Admitting: Family Medicine

## 2024-09-21 VITALS — BP 98/62 | HR 88 | Temp 98.4°F | Ht 60.0 in | Wt 135.0 lb

## 2024-09-21 DIAGNOSIS — R7309 Other abnormal glucose: Secondary | ICD-10-CM

## 2024-09-21 DIAGNOSIS — R202 Paresthesia of skin: Secondary | ICD-10-CM

## 2024-09-21 LAB — POCT GLYCOSYLATED HEMOGLOBIN (HGB A1C): Hemoglobin A1C: 4.9 % (ref 4.0–5.6)

## 2024-09-21 NOTE — Progress Notes (Signed)
 Established Patient Office Visit   Subjective  Patient ID: Angela Ashley, female    DOB: [birth year]  Age: 38 y.o. MRN: [id number]  Chief Complaint  Patient presents with   Acute Visit    Patient came in today for A1c check patient was seen at a [place] [facility] and A1c was high at that time, also patient has been having tingling in her hands in the last year     Pt is a 38 yo female seen for acute concern.  Pt reports elevated Hgb A1C of 6 something at a recent community awareness event.  Can't remember exact reading.  Pt does not eat sweets but does like rice, pasta, and juice.  Drinks 2 cups of juice most days and propel water .   Occasionally has a [employer].  Switched to 0 sugar juice.  Pt's grandmother and another relative with h/o DM.  Pt also having intermittent tingling in fingertips of L hand.  Pt is L handed.  Denies increased typing at work.  Does do repetitive motions as a surgical tech.  Shakes hands for relief.  Has not woken her up from sleep.  Denies edema, paresthesias elsewehere, neck pain.  Had carpal tunnel before, worse with pregnancy causing her to be unable to use her hands.  Would like to avoid surgery.      Patient Active Problem List   Diagnosis Date Noted   Sickle cell trait in mother affecting pregnancy 06/13/2022   Vitamin D  deficiency 02/25/2022   Postpartum care following cesarean delivery 04/30/2020   Anemia, iron  deficiency 04/30/2020   Encounter for maternal care for low transverse scar from previous cesarean delivery 04/29/2020   Encounter for maternal care for scar from repeat cesarean delivery 04/29/2020   Status post repeat low transverse cesarean section 04/29/2020   S/P tubal ligation 04/29/2020   Morning sickness 09/26/2019   Intrauterine pregnancy 09/26/2019   Subchorionic hemorrhage 09/26/2019   Bacterial vaginosis 06/20/2015   Past Medical History:  Diagnosis Date   Abnormal Pap smear    Asthma    no inhaler   Bacterial vaginosis     reoccuring   Chlamydia    Herpes    Sickle cell trait    Past Surgical History:  Procedure Laterality Date   CESAREAN SECTION N/A 03/26/2013   Procedure: Primary CESAREAN SECTION of baby girl  at  2051  APGAR 9/9;  Surgeon: Debby JULIANNA Lares, MD;  Location: [facility];  Service: Obstetrics;  Laterality: N/A;   CESAREAN SECTION WITH BILATERAL TUBAL LIGATION Bilateral 04/29/2020   Procedure: CESAREAN SECTION WITH BILATERAL TUBAL LIGATION;  Surgeon: Gloriann Chick, MD;  Location: [facility];  Service: Obstetrics;  Laterality: Bilateral;   COLPOSCOPY W/ BIOPSY / CURETTAGE     Dr. in [place]   Social History   Tobacco Use   Smoking status: Former    Types: Cigarettes   Smokeless tobacco: Never  Vaping Use   Vaping status: Never Used  Substance Use Topics   Alcohol use: Yes    Alcohol/week: 1.0 standard drink of alcohol    Types: 1 Glasses of wine per week    Comment: Occassional use before pregnancy   Drug use: No   Family History  Problem Relation Age of Onset   Healthy Mother    Healthy Father    Migraines Sister    Stroke Maternal Aunt    Hypertension Maternal Grandmother    Dementia Paternal Grandmother    Diabetes Paternal Grandmother  No Known Allergies  ROS Negative unless stated above    Objective:     BP 98/62 (BP Location: Left Arm, Patient Position: Sitting, Cuff Size: Normal)   Pulse 88   Temp 98.4 F (36.9 C) (Oral)   Ht 5' (1.524 m)   Wt 135 lb (61.2 kg)   LMP 09/09/2024   SpO2 98%   BMI 26.37 kg/m  BP Readings from Last 3 Encounters:  09/21/24 98/62  06/08/24 104/71  03/17/24 110/88   Wt Readings from Last 3 Encounters:  09/21/24 135 lb (61.2 kg)  06/08/24 137 lb (62.1 kg)  03/17/24 139 lb (63 kg)      Physical Exam Constitutional:      General: She is not in acute distress.    Appearance: Normal appearance.  HENT:     Head: Normocephalic and atraumatic.     Nose: Nose normal.     Mouth/Throat:     Mouth: Mucous membranes are  moist.  Cardiovascular:     Rate and Rhythm: Normal rate and regular rhythm.     Heart sounds: Normal heart sounds. No murmur heard.    No gallop.  Pulmonary:     Effort: Pulmonary effort is normal. No respiratory distress.     Breath sounds: Normal breath sounds. No wheezing, rhonchi or rales.  Musculoskeletal:     Comments: No edema of b/l hands.  Skin:    General: Skin is warm and dry.  Neurological:     Mental Status: She is alert and oriented to person, place, and time.     Cranial Nerves: Cranial nerves 2-12 are intact.     Comments: + tinnel's of L wrist.  Negative phalen's.          09/21/2024    4:15 PM 11/06/2023    1:42 PM 04/09/2023   11:53 AM  Depression screen PHQ 2/9  Decreased Interest 0 0 1  Down, Depressed, Hopeless 0 0 0  PHQ - 2 Score 0 0 1  Altered sleeping 0 1 1  Tired, decreased energy 1 0 2  Change in appetite 0 0 0  Feeling bad or failure about yourself  0 0 0  Trouble concentrating 0 1 0  Moving slowly or fidgety/restless 0 0 0  Suicidal thoughts 0 0 0  PHQ-9 Score 1 2 4   Difficult doing work/chores  Not difficult at all Somewhat difficult      09/21/2024    4:15 PM 11/06/2023    1:42 PM 04/09/2023   11:53 AM  GAD 7 : Generalized Anxiety Score  Nervous, Anxious, on Edge 1 1 2   Control/stop worrying 0 0 2  Worry too much - different things 0 0 1  Trouble relaxing 1 1   Restless 0 0 0  Easily annoyed or irritable 1 1 1   Afraid - awful might happen 0 0 2  Total GAD 7 Score 3 3   Anxiety Difficulty  Not difficult at all      Results for orders placed or performed in visit on 09/21/24  POC HgB A1c  Result Value Ref Range   Hemoglobin A1C 4.9 4.0 - 5.6 %   HbA1c POC (<> result, manual entry)     HbA1c, POC (prediabetic range)     HbA1c, POC (controlled diabetic range)        Assessment & Plan:   Elevated hemoglobin A1c measurement -     POCT glycosylated hemoglobin (Hb A1C)  Paresthesias in left hand  A1C reported  as elevated on  outside testing.  Repeat POC A1C this visit 4.9%.  pt advised to continue being mindful of carb intake and intake of sugary beverages.     Paresthesias of fingers, L hand likely from carpal tunnel.  Discussed supportive care including splints, ergonomic workspace modifications when able, ice, heat, stretching, OTC, NSAIDs may or may not be useful.  Can also try vitamin B6 200 mg daily.  For continued or worsening symptoms consider NCS/EMG and referral to hand surgery for injections versus discussion regarding surgery.  Ultimately patient would like to avoid surgery.  Return if symptoms worsen or fail to improve, for physical.   Clotilda JONELLE Single, MD

## 2024-09-21 NOTE — Patient Instructions (Addendum)
 Your Hgb A1C was 4.9% this visit.  This is well within the normal range.  5.7%-6.4% is prediabetes, while 6.5% and up is Diabetes.  Continue working on cutting down on juice intake and carbs.

## 2024-10-20 ENCOUNTER — Encounter: Payer: Self-pay | Admitting: Neurology

## 2024-10-28 ENCOUNTER — Telehealth: Admitting: Family Medicine

## 2024-10-28 DIAGNOSIS — M7918 Myalgia, other site: Secondary | ICD-10-CM

## 2024-10-28 MED ORDER — CYCLOBENZAPRINE HCL 10 MG PO TABS: 10.0000 mg | ORAL_TABLET | Freq: Three times a day (TID) | ORAL | 0 refills | Status: AC | PRN

## 2024-10-28 MED ORDER — NAPROXEN 500 MG PO TABS: 500.0000 mg | ORAL_TABLET | Freq: Two times a day (BID) | ORAL | 0 refills | Status: AC

## 2024-10-28 NOTE — Progress Notes (Signed)
 Virtual Visit Consent   Angela Ashley, you are scheduled for a virtual visit with a [facility] provider today. Just as with appointments in the office, your consent must be obtained to participate. Your consent will be active for this visit and any virtual visit you may have with one of our providers in the next 365 days. If you have a MyChart account, a copy of this consent can be sent to you electronically.  As this is a virtual visit, video technology does not allow for your provider to perform a traditional examination. This may limit your provider's ability to fully assess your condition. If your provider identifies any concerns that need to be evaluated in person or the need to arrange testing (such as labs, EKG, etc.), we will make arrangements to do so. Although advances in technology are sophisticated, we cannot ensure that it will always work on either your end or our end. If the connection with a video visit is poor, the visit may have to be switched to a telephone visit. With either a video or telephone visit, we are not always able to ensure that we have a secure connection.  By engaging in this virtual visit, you consent to the provision of healthcare and authorize for your insurance to be billed (if applicable) for the services provided during this visit. Depending on your insurance coverage, you may receive a charge related to this service.  I need to obtain your verbal consent now. Are you willing to proceed with your visit today? AZELIA REIGER has provided verbal consent on 10/28/2024 for a virtual visit (video or telephone). Roosvelt Ashley, [place]  Date: 10/28/2024 3:21 PM   Virtual Visit via Video Note   I, Roosvelt Ashley, connected with  TYLEE YUM  ([id number], 25-Sep-1986) on 10/28/24 at  3:15 PM EST by a video-enabled telemedicine application and verified that I am speaking with the correct person using two identifiers.  Location: Patient: Virtual Visit Location  Patient: Home Provider: Virtual Visit Location Provider: Home Office   I discussed the limitations of evaluation and management by telemedicine and the availability of in person appointments. The patient expressed understanding and agreed to proceed.    History of Present Illness: Angela Ashley is a 38 y.o. who identifies as a female who was assigned female at birth, and is being seen today for c/o last week Friday she fell on her buttocks really hard and her buttocks are still hurting.  Pt states she took a bath but it is still hurting.  Pt states has not tried any medications to help with symptoms.  Pt states the pain is not in her tail bone but the muscles of her buttocks.   HPI: HPI  Problems:  Patient Active Problem List   Diagnosis Date Noted   Sickle cell trait in mother affecting pregnancy 06/13/2022   Vitamin D  deficiency 02/25/2022   Postpartum care following cesarean delivery 04/30/2020   Anemia, iron  deficiency 04/30/2020   Encounter for maternal care for low transverse scar from previous cesarean delivery 04/29/2020   Encounter for maternal care for scar from repeat cesarean delivery 04/29/2020   Status post repeat low transverse cesarean section 04/29/2020   S/P tubal ligation 04/29/2020   Morning sickness 09/26/2019   Intrauterine pregnancy 09/26/2019   Subchorionic hemorrhage 09/26/2019   Bacterial vaginosis 06/20/2015    Allergies: No Known Allergies Medications:  Current Outpatient Medications:    cyclobenzaprine  (FLEXERIL ) 10 MG tablet, Take 1 tablet (10 mg  total) by mouth 3 (three) times daily as needed for up to 5 days for muscle spasms., Disp: 15 tablet, Rfl: 0   naproxen (NAPROSYN) 500 MG tablet, Take 1 tablet (500 mg total) by mouth 2 (two) times daily with a meal for 5 days., Disp: 10 tablet, Rfl: 0   diazepam  (VALIUM ) 5 MG tablet, Take 1 tablet 30 to 40 minutes prior to MRI.  May repeat dose at MRI if needed., Disp: 2 tablet, Rfl: 0   hydrOXYzine  (ATARAX )  25 MG tablet, Take 1 tablet (25 mg total) by mouth at bedtime as needed for anxiety., Disp: 30 tablet, Rfl: 0  Observations/Objective: Patient is well-developed, well-nourished in no acute distress.  Resting comfortably at home.  Head is normocephalic, atraumatic.  No labored breathing.  Speech is clear and coherent with logical content.  Patient is alert and oriented at baseline.    Assessment and Plan: 1. Buttock pain (Primary) - naproxen (NAPROSYN) 500 MG tablet; Take 1 tablet (500 mg total) by mouth 2 (two) times daily with a meal for 5 days.  Dispense: 10 tablet; Refill: 0 - cyclobenzaprine  (FLEXERIL ) 10 MG tablet; Take 1 tablet (10 mg total) by mouth 3 (three) times daily as needed for up to 5 days for muscle spasms.  Dispense: 15 tablet; Refill: 0  -Pt advised if no improvement with medications, she should follow up in person with PCP or urgent care.   Follow Up Instructions: I discussed the assessment and treatment plan with the patient. The patient was provided an opportunity to ask questions and all were answered. The patient agreed with the plan and demonstrated an understanding of the instructions.  A copy of instructions were sent to the patient via MyChart unless otherwise noted below.    The patient was advised to call back or seek an in-person evaluation if the symptoms worsen or if the condition fails to improve as anticipated.    Roosvelt Mater, PA-C

## 2024-10-28 NOTE — Patient Instructions (Signed)
 Rona ONEIDA Gails, thank you for joining Roosvelt Mater, PA-C for today's virtual visit.  While this provider is not your primary care provider (PCP), if your PCP is located in our provider database this encounter information will be shared with them immediately following your visit.   A [facility] MyChart account gives you access to today's visit and all your visits, tests, and labs performed at [facility]  click here if you don't have a [facility] MyChart account or go to [email or web address]  Consent: (Patient) Rona ONEIDA Gails provided verbal consent for this virtual visit at the beginning of the encounter.  Current Medications:  Current Outpatient Medications:    cyclobenzaprine  (FLEXERIL ) 10 MG tablet, Take 1 tablet (10 mg total) by mouth 3 (three) times daily as needed for up to 5 days for muscle spasms., Disp: 15 tablet, Rfl: 0   naproxen (NAPROSYN) 500 MG tablet, Take 1 tablet (500 mg total) by mouth 2 (two) times daily with a meal for 5 days., Disp: 10 tablet, Rfl: 0   diazepam  (VALIUM ) 5 MG tablet, Take 1 tablet 30 to 40 minutes prior to MRI.  May repeat dose at MRI if needed., Disp: 2 tablet, Rfl: 0   hydrOXYzine  (ATARAX ) 25 MG tablet, Take 1 tablet (25 mg total) by mouth at bedtime as needed for anxiety., Disp: 30 tablet, Rfl: 0   Medications ordered in this encounter:  Meds ordered this encounter  Medications   naproxen (NAPROSYN) 500 MG tablet    Sig: Take 1 tablet (500 mg total) by mouth 2 (two) times daily with a meal for 5 days.    Dispense:  10 tablet    Refill:  0   cyclobenzaprine  (FLEXERIL ) 10 MG tablet    Sig: Take 1 tablet (10 mg total) by mouth 3 (three) times daily as needed for up to 5 days for muscle spasms.    Dispense:  15 tablet    Refill:  0     *If you need refills on other medications prior to your next appointment, please contact your pharmacy*  Follow-Up: Call back or seek an in-person evaluation if the symptoms worsen or if the  condition fails to improve as anticipated.  [facility] [phone]  Other Instructions Muscle Pain, Adult Muscle pain, also called myalgia, is a condition in which a person has pain in one or more muscles in the body. The pain may be mild, moderate, or severe. It may feel sharp, achy, or burning. In most cases, the pain lasts only a short time and goes away on its own. It is normal to feel some muscle pain after you start a new exercise program. Muscles that have not been used a lot will be sore at first. What are the causes? You may have muscle pain when you use your muscles in a new or different way after not having used them for some time. Muscle pain can also be caused by overuse or by stretching a muscle beyond its normal length (muscle strain). You may be more likely to have muscle pain if you are not in shape. Other causes may include: Injury or bruising. Infectious diseases. These include diseases caused by viruses, such as the flu (influenza). Fibromyalgia. This is a long-term (chronic) condition that causes muscle tenderness, tiredness (fatigue), and headache. Autoimmune or rheumatologic diseases. These are conditions, such as lupus, that cause the body's defense system (immune system) to attack areas in the body. Certain medicines. These include ACE inhibitors and  statins. What are the signs or symptoms? The main symptom is sore or painful muscles. Your muscles may be sore when you do activities and when you stretch. You may also have slight swelling. How is this diagnosed? Muscle pain is diagnosed with a physical exam. Your health care provider will ask questions about your pain and when it began. If you have not had muscle pain for very long, your provider may want to wait before doing much testing. If your muscle pain has lasted a long time, tests may be done right away. In some cases, you may need tests to rule out other conditions and diseases. How is this  treated? Treatment for muscle pain depends on the cause. Home care is usually enough to relieve the pain. Your provider may also prescribe NSAIDs, such as ibuprofen . Follow these instructions at home: Medicines Take over-the-counter and prescription medicines only as told by your provider. Ask your health care provider if the medicine prescribed to you requires you to avoid driving or using machinery. Managing pain, swelling, and discomfort     If told, put ice on the painful area for the first 2 days of soreness. Put ice in a plastic bag. Place a towel between your skin and the bag. Leave the ice on for 20 minutes, 2-3 times a day. If your skin turns bright red, remove the ice right away to prevent skin damage. The risk of damage is higher if you cannot feel pain, heat, or cold. For the first 2 days of muscle soreness, or if there is swelling: Do not soak in hot baths. Do not use a hot tub, steam room, sauna, heating pad, or other heat source. After 2-3 days, you may switch between putting ice and heat on the area. If told, apply heat to the affected area as often as told by your provider. Use the heat source that your recommends, such as a moist heat pack or a heating pad. Place a towel between your skin and the heat source. Leave the heat on for 20-30 minutes. If your skin turns bright red, remove the ice or heat right away to prevent skin damage. The risk of damage is higher if you cannot feel pain, heat, or cold. If you have an injury, raise (elevate) the injured area above the level of your heart while you are sitting or lying down. Activity  If your muscle pain is caused by overuse: Slow down your activities until the pain goes away. Do regular, gentle exercises if you are not normally active. Warm up before you exercise. Stretch before and after you exercise. This can help lower the risk of muscle pain. Do not keep working out if the pain is severe. Severe pain could mean that  you have injured a muscle. You may have to avoid lifting. Ask your provider how much you can safely lift. Return to your normal activities as told by your provider. Ask your provider what activities are safe for you. General instructions Do not use any products that contain nicotine or tobacco. These products include cigarettes, chewing tobacco, and vaping devices, such as e-cigarettes. If you need help quitting, ask your provider. Contact a health care provider if: Your muscle pain gets worse, and medicines do not help. The muscle pain lasts longer than 3 days. You have a rash or fever. You have muscle pain after a tick bite. You have muscle pain when you work out, even though you are in good shape. You have redness, soreness, or  swelling. You have muscle pain after you start a new medicine or change the dose of a medicine. Get help right away if: You have trouble breathing. You have trouble swallowing. You have muscle pain along with a stiff neck, fever, and vomiting. You have severe muscle weakness, or you cannot move part of your body. You are urinating less, or you have dark, bloody, or discolored urine. You have redness or swelling at the site of the muscle pain. These symptoms may be an emergency. Get help right away. Call 911. Do not wait to see if the symptoms will go away. Do not drive yourself to the hospital. This information is not intended to replace advice given to you by your health care provider. Make sure you discuss any questions you have with your health care provider. Document Revised: 06/27/2022 Document Reviewed: 06/27/2022 Elsevier Patient Education  2024 [employer]   If you have been instructed to have an in-person evaluation today at a local Urgent Care facility, please use the link below. It will take you to a list of all of our available [facility], including address, phone number and hours of operation. Please do not delay care.  [facility]  If you or a family member do not have a primary care provider, use the link below to schedule a visit and establish care. When you choose a [facility] primary care physician or advanced practice provider, you gain a long-term partner in health. Find a Primary Care Provider  Learn more about [facility] in-office and virtual care options: [facility] - Get Care Now

## 2025-02-06 ENCOUNTER — Ambulatory Visit: Admitting: Neurology

## 2025-02-08 ENCOUNTER — Ambulatory Visit: Admitting: Neurology
